# Patient Record
Sex: Male | Born: 1985 | Race: White | Hispanic: No | Marital: Married | Smoking: Never smoker
Health system: Southern US, Community
[De-identification: ages and names within clinical notes are randomized; demographics above are authoritative.]

## PROBLEM LIST (undated history)

## (undated) DIAGNOSIS — Z8619 Personal history of other infectious and parasitic diseases: Secondary | ICD-10-CM

## (undated) DIAGNOSIS — Z87820 Personal history of traumatic brain injury: Secondary | ICD-10-CM

## (undated) DIAGNOSIS — C6290 Malignant neoplasm of unspecified testis, unspecified whether descended or undescended: Secondary | ICD-10-CM

## (undated) HISTORY — PX: HERNIA REPAIR: SHX51

## (undated) HISTORY — DX: Malignant neoplasm of unspecified testis, unspecified whether descended or undescended: C62.90

## (undated) HISTORY — PX: KNEE ARTHROSCOPY: SHX127

## (undated) HISTORY — DX: Personal history of other infectious and parasitic diseases: Z86.19

---

## 2013-10-26 ENCOUNTER — Emergency Department (HOSPITAL_BASED_OUTPATIENT_CLINIC_OR_DEPARTMENT_OTHER)
Admission: EM | Admit: 2013-10-26 | Discharge: 2013-10-27 | Disposition: A | Discharge status: Home / Self Care | Payer: BC Managed Care – PPO | Attending: Emergency Medicine | Admitting: Emergency Medicine

## 2013-10-26 ENCOUNTER — Encounter (HOSPITAL_BASED_OUTPATIENT_CLINIC_OR_DEPARTMENT_OTHER): Payer: Self-pay | Admitting: Emergency Medicine

## 2013-10-26 DIAGNOSIS — R109 Unspecified abdominal pain: Secondary | ICD-10-CM

## 2013-10-26 DIAGNOSIS — R197 Diarrhea, unspecified: Secondary | ICD-10-CM | POA: Insufficient documentation

## 2013-10-26 DIAGNOSIS — R112 Nausea with vomiting, unspecified: Secondary | ICD-10-CM | POA: Insufficient documentation

## 2013-10-26 DIAGNOSIS — IMO0001 Reserved for inherently not codable concepts without codable children: Secondary | ICD-10-CM | POA: Insufficient documentation

## 2013-10-26 DIAGNOSIS — R1084 Generalized abdominal pain: Secondary | ICD-10-CM | POA: Insufficient documentation

## 2013-10-26 DIAGNOSIS — R1110 Vomiting, unspecified: Secondary | ICD-10-CM

## 2013-10-26 LAB — CBC WITH DIFFERENTIAL/PLATELET
Basophils Absolute: 0 10*3/uL (ref 0.0–0.1)
Basophils Relative: 0 % (ref 0–1)
EOS PCT: 0 % (ref 0–5)
Eosinophils Absolute: 0.1 10*3/uL (ref 0.0–0.7)
HEMATOCRIT: 46.6 % (ref 39.0–52.0)
Hemoglobin: 16.3 g/dL (ref 13.0–17.0)
LYMPHS ABS: 0.5 10*3/uL — AB (ref 0.7–4.0)
Lymphocytes Relative: 3 % — ABNORMAL LOW (ref 12–46)
MCH: 30 pg (ref 26.0–34.0)
MCHC: 35 g/dL (ref 30.0–36.0)
MCV: 85.8 fL (ref 78.0–100.0)
Monocytes Absolute: 0.7 10*3/uL (ref 0.1–1.0)
Monocytes Relative: 5 % (ref 3–12)
NEUTROS PCT: 92 % — AB (ref 43–77)
Neutro Abs: 14.8 10*3/uL — ABNORMAL HIGH (ref 1.7–7.7)
Platelets: 223 10*3/uL (ref 150–400)
RBC: 5.43 MIL/uL (ref 4.22–5.81)
RDW: 13.3 % (ref 11.5–15.5)
WBC: 16.1 10*3/uL — ABNORMAL HIGH (ref 4.0–10.5)

## 2013-10-26 MED ORDER — MORPHINE SULFATE 4 MG/ML IJ SOLN
4.0000 mg | Freq: Once | INTRAMUSCULAR | Status: AC
  Administered 2013-10-27: 4 mg via INTRAVENOUS
  Filled 2013-10-26: qty 1

## 2013-10-26 MED ORDER — ONDANSETRON HCL 4 MG/2ML IJ SOLN
4.0000 mg | Freq: Once | INTRAMUSCULAR | Status: AC
  Administered 2013-10-27: 4 mg via INTRAVENOUS
  Filled 2013-10-26: qty 2

## 2013-10-26 MED ORDER — SODIUM CHLORIDE 0.9 % IV BOLUS (SEPSIS)
1000.0000 mL | Freq: Once | INTRAVENOUS | Status: AC
  Administered 2013-10-27 (×2): 1000 mL via INTRAVENOUS

## 2013-10-26 NOTE — ED Notes (Signed)
Onset of generalized abdominal pain around 10 am this morning.  Vomiting/diarrhea started approximately 1500.  Profuse vomiting on multiple occassions.  Patient has reddened eyes.  C/o generalized 'squeezing' abdominal pain.  Noting blood in vomit now.

## 2013-10-26 NOTE — ED Provider Notes (Signed)
CSN: [id number]     Arrival date & time 10/26/13  2212 History   First MD Initiated Contact with Patient 10/26/13 2248     Chief Complaint  Patient presents with  . Emesis   (Consider location/radiation/quality/duration/timing/severity/associated sxs/prior Treatment) Patient is a 28 y.o. male presenting with vomiting. The history is provided by the patient. No language interpreter was used.  Emesis Severity:  Severe Duration:  1 day Associated symptoms: abdominal pain, chills, diarrhea and myalgias   Associated symptoms comment:  He started having nausea with abdominal cramping this morning, with multiple episodes of violent vomiting. There was BRB mixed with emesis later in the day that has improved. Non-bloody diarrhea as well. No known fever, but he reports hot/cold chills. No one else at home is ill. He has significant cramping affecting abdomen as well as distal extremities.    History reviewed. No pertinent past medical history. History reviewed. No pertinent past surgical history. No family history on file. History  Substance Use Topics  . Smoking status: Never Smoker   . Smokeless tobacco: Not on file  . Alcohol Use: No    Review of Systems  Constitutional: Positive for chills. Negative for fever.  HENT: Negative.   Respiratory: Negative.  Negative for cough.   Cardiovascular: Negative.  Negative for chest pain.  Gastrointestinal: Positive for nausea, vomiting, abdominal pain and diarrhea. Negative for blood in stool.  Genitourinary: Negative.  Negative for dysuria.  Musculoskeletal: Positive for myalgias.  Skin: Negative.  Negative for rash.  Neurological: Negative.  Negative for weakness.    Allergies  Naproxen  Home Medications  No current outpatient prescriptions on file. BP 137/79  Pulse 66  Temp(Src) 98.4 F (36.9 C) (Oral)  Resp 16  Ht 6' (1.829 m)  Wt 185 lb (83.915 kg)  BMI 25.08 kg/m2  SpO2 99% Physical Exam  Constitutional: He is oriented to  person, place, and time. He appears well-developed and well-nourished.  HENT:  Head: Normocephalic.  Neck: Normal range of motion. Neck supple.  Cardiovascular: Normal rate and regular rhythm.   Pulmonary/Chest: Effort normal and breath sounds normal.  Abdominal: Soft. Bowel sounds are normal. There is tenderness. There is no rebound and no guarding.  Generalized abdominal tenderness to soft abdomen. No mass. Non-distended.  Musculoskeletal: Normal range of motion.  Neurological: He is alert and oriented to person, place, and time.  Skin: Skin is warm and dry. No rash noted.  Psychiatric: He has a normal mood and affect.    ED Course  Procedures (including critical care time) Labs Review Labs Reviewed  URINALYSIS, ROUTINE W REFLEX MICROSCOPIC  BASIC METABOLIC PANEL  CBC WITH DIFFERENTIAL   Imaging Review No results found.  EKG Interpretation   None       MDM  No diagnosis found. 1. N, V, D  Patient care transferred to Dr. Thayer Jew pending labs and re-evaluation.    Dewaine Oats, PA-C 10/26/13 2354

## 2013-10-27 LAB — BASIC METABOLIC PANEL
BUN: 16 mg/dL (ref 6–23)
CHLORIDE: 104 meq/L (ref 96–112)
CO2: 22 meq/L (ref 19–32)
Calcium: 9.8 mg/dL (ref 8.4–10.5)
Creatinine, Ser: 0.9 mg/dL (ref 0.50–1.35)
GFR calc Af Amer: >90 mL/min (ref >90)
GFR calc non Af Amer: >90 mL/min (ref >90)
GLUCOSE: 121 mg/dL — AB (ref 70–99)
POTASSIUM: 4.2 meq/L (ref 3.7–5.3)
Sodium: 144 mEq/L (ref 137–147)

## 2013-10-27 MED ORDER — ONDANSETRON HCL 4 MG PO TABS: 4.0000 mg | ORAL_TABLET | Freq: Four times a day (QID) | ORAL | Status: DC

## 2013-10-27 MED ORDER — LOPERAMIDE HCL 2 MG PO CAPS: 2.0000 mg | ORAL_CAPSULE | Freq: Four times a day (QID) | ORAL | Status: DC | PRN

## 2013-10-27 NOTE — ED Provider Notes (Signed)
Received patient in sign out from [place], [place].  Patient presented with abdominal pain, nausea, vomiting, and diarrhea. Otherwise healthy. Patient has had multiple episodes of diarrhea here and appears clinically dry. Patient was given 2 L of normal saline and Zofran. Lab work is notable for leukocytosis to 16.  Otherwise lab work is reassuring. Following 2 L of fluid, patient endorses improvement of symptoms.  Exam is benign. Patient was given a fluid challenge and was able tolerate by mouth. Patient will be discharged with Zofran and Imodium for what is likely viral gastroenteritis. Patient was given strict return precautions.  After history, exam, and medical workup I feel the patient has been appropriately medically screened and is safe for discharge home. Pertinent diagnoses were discussed with the patient. Patient was given return precautions.  Results for orders placed during the hospital encounter of 93/79/02  BASIC METABOLIC PANEL      Result Value Range   Sodium 144  137 - 147 mEq/L   Potassium 4.2  3.7 - 5.3 mEq/L   Chloride 104  96 - 112 mEq/L   CO2 22  19 - 32 mEq/L   Glucose, Bld 121 (*) 70 - 99 mg/dL   BUN 16  6 - 23 mg/dL   Creatinine, Ser 0.90  0.50 - 1.35 mg/dL   Calcium 9.8  8.4 - 10.5 mg/dL   GFR calc non Af Amer >90  >90 mL/min   GFR calc Af Amer >90  >90 mL/min  CBC WITH DIFFERENTIAL      Result Value Range   WBC 16.1 (*) 4.0 - 10.5 K/uL   RBC 5.43  4.22 - 5.81 MIL/uL   Hemoglobin 16.3  13.0 - 17.0 g/dL   HCT 46.6  39.0 - 52.0 %   MCV 85.8  78.0 - 100.0 fL   MCH 30.0  26.0 - 34.0 pg   MCHC 35.0  30.0 - 36.0 g/dL   RDW 13.3  11.5 - 15.5 %   Platelets 223  150 - 400 K/uL   Neutrophils Relative % 92 (*) 43 - 77 %   Neutro Abs 14.8 (*) 1.7 - 7.7 K/uL   Lymphocytes Relative 3 (*) 12 - 46 %   Lymphs Abs 0.5 (*) 0.7 - 4.0 K/uL   Monocytes Relative 5  3 - 12 %   Monocytes Absolute 0.7  0.1 - 1.0 K/uL   Eosinophils Relative 0  0 - 5 %   Eosinophils Absolute 0.1  0.0 -  0.7 K/uL   Basophils Relative 0  0 - 1 %   Basophils Absolute 0.0  0.0 - 0.1 K/uL   No results found.    Merryl Hacker, MD 10/27/13 [phone]

## 2013-10-27 NOTE — ED Provider Notes (Signed)
Medical screening examination/treatment/procedure(s) were conducted as a shared visit with non-physician practitioner(s) and myself.  I personally evaluated the patient during the encounter.  EKG Interpretation   None       Please see separate note for details.  Merryl Hacker, MD 10/27/13 [phone]

## 2013-10-27 NOTE — Discharge Instructions (Signed)
Viral Gastroenteritis Viral gastroenteritis is also known as stomach flu. This condition affects the stomach and intestinal tract. It can cause sudden diarrhea and vomiting. The illness typically lasts 3 to 8 days. Most people develop an immune response that eventually gets rid of the virus. While this natural response develops, the virus can make you quite ill. CAUSES  Many different viruses can cause gastroenteritis, such as rotavirus or noroviruses. You can catch one of these viruses by consuming contaminated food or water. You may also catch a virus by sharing utensils or other personal items with an infected person or by touching a contaminated surface. SYMPTOMS  The most common symptoms are diarrhea and vomiting. These problems can cause a severe loss of body fluids (dehydration) and a body salt (electrolyte) imbalance. Other symptoms may include:  Fever.  Headache.  Fatigue.  Abdominal pain. DIAGNOSIS  Your caregiver can usually diagnose viral gastroenteritis based on your symptoms and a physical exam. A stool sample may also be taken to test for the presence of viruses or other infections. TREATMENT  This illness typically goes away on its own. Treatments are aimed at rehydration. The most serious cases of viral gastroenteritis involve vomiting so severely that you are not able to keep fluids down. In these cases, fluids must be given through an intravenous line (IV). HOME CARE INSTRUCTIONS   Drink enough fluids to keep your urine clear or pale yellow. Drink small amounts of fluids frequently and increase the amounts as tolerated.  Ask your caregiver for specific rehydration instructions.  Avoid:  Foods high in sugar.  Alcohol.  Carbonated drinks.  Tobacco.  Juice.  Caffeine drinks.  Extremely hot or cold fluids.  Fatty, greasy foods.  Too much intake of anything at one time.  Dairy products until 24 to 48 hours after diarrhea stops.  You may consume probiotics.  Probiotics are active cultures of beneficial bacteria. They may lessen the amount and number of diarrheal stools in adults. Probiotics can be found in yogurt with active cultures and in supplements.  Wash your hands well to avoid spreading the virus.  Only take over-the-counter or prescription medicines for pain, discomfort, or fever as directed by your caregiver. Do not give aspirin to children. Antidiarrheal medicines are not recommended.  Ask your caregiver if you should continue to take your regular prescribed and over-the-counter medicines.  Keep all follow-up appointments as directed by your caregiver. SEEK IMMEDIATE MEDICAL CARE IF:   You are unable to keep fluids down.  You do not urinate at least once every 6 to 8 hours.  You develop shortness of breath.  You notice blood in your stool or vomit. This may look like coffee grounds.  You have abdominal pain that increases or is concentrated in one small area (localized).  You have persistent vomiting or diarrhea.  You have a fever.  The patient is a child younger than 3 months, and he or she has a fever.  The patient is a child older than 3 months, and he or she has a fever and persistent symptoms.  The patient is a child older than 3 months, and he or she has a fever and symptoms suddenly get worse.  The patient is a baby, and he or she has no tears when crying. MAKE SURE YOU:   Understand these instructions.  Will watch your condition.  Will get help right away if you are not doing well or get worse. Document Released: 09/12/2005 Document Revised: 12/05/2011 Document Reviewed: 06/29/2011   Exit[employer] Patient Information 2014 [employer].  

## 2017-09-29 ENCOUNTER — Emergency Department (HOSPITAL_COMMUNITY)
Admission: EM | Admit: 2017-09-29 | Discharge: 2017-09-29 | Disposition: A | Discharge status: Home / Self Care | Payer: BLUE CROSS/BLUE SHIELD | Attending: Emergency Medicine | Admitting: Emergency Medicine

## 2017-09-29 ENCOUNTER — Emergency Department (HOSPITAL_COMMUNITY): Payer: BLUE CROSS/BLUE SHIELD

## 2017-09-29 ENCOUNTER — Other Ambulatory Visit: Payer: Self-pay

## 2017-09-29 ENCOUNTER — Encounter (HOSPITAL_COMMUNITY): Payer: Self-pay | Admitting: *Deleted

## 2017-09-29 DIAGNOSIS — Y998 Other external cause status: Secondary | ICD-10-CM | POA: Insufficient documentation

## 2017-09-29 DIAGNOSIS — Y9389 Activity, other specified: Secondary | ICD-10-CM | POA: Insufficient documentation

## 2017-09-29 DIAGNOSIS — R110 Nausea: Secondary | ICD-10-CM | POA: Diagnosis not present

## 2017-09-29 DIAGNOSIS — Y92012 Bathroom of single-family (private) house as the place of occurrence of the external cause: Secondary | ICD-10-CM | POA: Diagnosis not present

## 2017-09-29 DIAGNOSIS — R55 Syncope and collapse: Secondary | ICD-10-CM | POA: Insufficient documentation

## 2017-09-29 DIAGNOSIS — R51 Headache: Secondary | ICD-10-CM | POA: Diagnosis not present

## 2017-09-29 DIAGNOSIS — W19XXXA Unspecified fall, initial encounter: Secondary | ICD-10-CM | POA: Diagnosis not present

## 2017-09-29 DIAGNOSIS — R519 Headache, unspecified: Secondary | ICD-10-CM

## 2017-09-29 HISTORY — DX: Personal history of traumatic brain injury: Z87.820

## 2017-09-29 LAB — COMPREHENSIVE METABOLIC PANEL
ALT: 27 U/L (ref 17–63)
AST: 23 U/L (ref 15–41)
Albumin: 4.3 g/dL (ref 3.5–5.0)
Alkaline Phosphatase: 62 U/L (ref 38–126)
Anion gap: 8 (ref 5–15)
BUN: 15 mg/dL (ref 6–20)
CO2: 23 mmol/L (ref 22–32)
Calcium: 9.1 mg/dL (ref 8.9–10.3)
Chloride: 106 mmol/L (ref 101–111)
Creatinine, Ser: 0.91 mg/dL (ref 0.61–1.24)
GFR calc non Af Amer: >60 mL/min (ref >60)
Glucose, Bld: 106 mg/dL — ABNORMAL HIGH (ref 65–99)
Potassium: 3.4 mmol/L — ABNORMAL LOW (ref 3.5–5.1)
Sodium: 137 mmol/L (ref 135–145)
Total Bilirubin: 1.1 mg/dL (ref 0.3–1.2)
Total Protein: 6.6 g/dL (ref 6.5–8.1)

## 2017-09-29 LAB — URINALYSIS, ROUTINE W REFLEX MICROSCOPIC
Bilirubin Urine: NEGATIVE
Glucose, UA: NEGATIVE mg/dL
HGB URINE DIPSTICK: NEGATIVE
Ketones, ur: NEGATIVE mg/dL
Leukocytes, UA: NEGATIVE
Nitrite: NEGATIVE
Protein, ur: NEGATIVE mg/dL
SPECIFIC GRAVITY, URINE: 1.009 (ref 1.005–1.030)
pH: 6 (ref 5.0–8.0)

## 2017-09-29 LAB — CBC
HCT: 44.8 % (ref 39.0–52.0)
HEMOGLOBIN: 15.2 g/dL (ref 13.0–17.0)
MCH: 29.8 pg (ref 26.0–34.0)
MCHC: 33.9 g/dL (ref 30.0–36.0)
MCV: 87.8 fL (ref 78.0–100.0)
Platelets: 195 10*3/uL (ref 150–400)
RBC: 5.1 MIL/uL (ref 4.22–5.81)
RDW: 13.1 % (ref 11.5–15.5)
WBC: 9.4 10*3/uL (ref 4.0–10.5)

## 2017-09-29 LAB — I-STAT TROPONIN, ED: Troponin i, poc: 0 ng/mL (ref 0.00–0.08)

## 2017-09-29 MED ORDER — DIPHENHYDRAMINE HCL 50 MG/ML IJ SOLN
25.0000 mg | Freq: Once | INTRAMUSCULAR | Status: AC
  Administered 2017-09-29: 25 mg via INTRAVENOUS
  Filled 2017-09-29: qty 1

## 2017-09-29 MED ORDER — ONDANSETRON HCL 4 MG PO TABS: 4.0000 mg | ORAL_TABLET | Freq: Three times a day (TID) | ORAL | 0 refills | Status: DC | PRN

## 2017-09-29 MED ORDER — METOCLOPRAMIDE HCL 5 MG/ML IJ SOLN
10.0000 mg | Freq: Once | INTRAMUSCULAR | Status: AC
  Administered 2017-09-29: 10 mg via INTRAVENOUS
  Filled 2017-09-29: qty 2

## 2017-09-29 MED ORDER — ONDANSETRON HCL 4 MG/2ML IJ SOLN
4.0000 mg | Freq: Once | INTRAMUSCULAR | Status: AC
  Administered 2017-09-29: 4 mg via INTRAVENOUS
  Filled 2017-09-29: qty 2

## 2017-09-29 MED ORDER — PROMETHAZINE HCL 25 MG/ML IJ SOLN
12.5000 mg | Freq: Once | INTRAMUSCULAR | Status: AC
  Administered 2017-09-29: 12.5 mg via INTRAVENOUS
  Filled 2017-09-29: qty 1

## 2017-09-29 MED ORDER — SODIUM CHLORIDE 0.9 % IV BOLUS (SEPSIS)
500.0000 mL | Freq: Once | INTRAVENOUS | Status: AC
  Administered 2017-09-29: 500 mL via INTRAVENOUS

## 2017-09-29 MED ORDER — SODIUM CHLORIDE 0.9 % IV BOLUS (SEPSIS)
1000.0000 mL | Freq: Once | INTRAVENOUS | Status: AC
  Administered 2017-09-29: 1000 mL via INTRAVENOUS

## 2017-09-29 NOTE — ED Notes (Signed)
Transported to xray for CT 

## 2017-09-29 NOTE — ED Provider Notes (Signed)
[facility] EMERGENCY DEPARTMENT Provider Note   CSN: [id number] Arrival date & time: 09/29/17  0346  Time seen 04:23 AM   History   Chief Complaint Chief Complaint  Patient presents with  . Loss of Consciousness    HPI Larance Ratledge is a 32 y.o. male.  HPI patient reports his wife is been ill all day with a GI bug.  About 2 AM therefore and 34/48-month-old child woke up with vomiting and she had vomited all over herself.  He states he jumped out of bed and grabbed the baby and was rushing down the hall to her dressing table to change her.  He states he felt like he was getting nauseated and after he got her gown off he felt like he was going to pass out.  He states he placed the baby back in her crib and started running down the hall to the bathroom.  He states when he got to the bathroom he was urinating and he started feeling like he was going to pass out and the next thing he knew he was awakened and had fallen over the toilet.  His wife helped him to the floor.  She states he was laying on his left side.  He is unsure if he hit his head.  She states he was unconscious for about a minute.  She denies diaphoresis but states he was very pale.  He states he had a concussion in 2017 and had posterior headaches for several months after that.  He was evaluated by a neurologist and Jule Ser.  He states he had a posterior headache all day today and states it felt like he had a thumb pressing on the back of his head.  He denies any neck pain.  He states he thinks he may have stubbed some toes.  He states normally other people vomiting does not affect him.  PCP Dr Olin [facility] in [place]  Past Medical History:  Diagnosis Date  . H/O multiple concussions   Sleep paralysis  There are no active problems to display for this patient.   Past Surgical History:  Procedure Laterality Date  . HERNIA REPAIR    . KNEE ARTHROSCOPY         Home Medications      Prior to Admission medications   Medication Sig Start Date End Date Taking? Authorizing Provider  loperamide (IMODIUM) 2 MG capsule Take 1 capsule (2 mg total) by mouth 4 (four) times daily as needed for diarrhea or loose stools. 10/27/13  Yes Horton, Barbette Hair, MD  ondansetron (ZOFRAN) 4 MG tablet Take 1 tablet (4 mg total) by mouth every 8 (eight) hours as needed for nausea or vomiting. 09/29/17   Rolland Porter, MD    Family History No family history on file.  Social History Social History   Tobacco Use  . Smoking status: Never Smoker  . Smokeless tobacco: Never Used  Substance Use Topics  . Alcohol use: No  . Drug use: No  lives with spouse   Allergies   Naproxen   Review of Systems Review of Systems  All other systems reviewed and are negative.    Physical Exam Updated Vital Signs BP 123/88 (BP Location: Right Arm)   Pulse 64   Temp 97.7 F (36.5 C) (Oral)   Resp 18   Ht 6' (1.829 m)   Wt 83.9 kg (185 lb)   SpO2 99%   BMI 25.09 kg/m   Vital signs normal  Physical Exam  Constitutional: He is oriented to person, place, and time. He appears well-developed and well-nourished.  Non-toxic appearance. He does not appear ill. No distress.  HENT:  Head: Normocephalic and atraumatic.  Right Ear: External ear normal.  Left Ear: External ear normal.  Nose: Nose normal. No mucosal edema or rhinorrhea.  Mouth/Throat: Mucous membranes are normal. No dental abscesses or uvula swelling.  Tongue is dry, there was no trauma to the tongue.  There is no visible trauma to his head.  Eyes: Conjunctivae and EOM are normal. Pupils are equal, round, and reactive to light.  Neck: Normal range of motion and full passive range of motion without pain. Neck supple.  Cardiovascular: Normal rate, regular rhythm and normal heart sounds. Exam reveals no gallop and no friction rub.  No murmur heard. Pulmonary/Chest: Effort normal and breath sounds normal. No respiratory distress. He has  no wheezes. He has no rhonchi. He has no rales. He exhibits no tenderness and no crepitus.  Abdominal: Soft. Normal appearance and bowel sounds are normal. He exhibits no distension. There is no tenderness. There is no rebound and no guarding.  Musculoskeletal: Normal range of motion. He exhibits no edema or tenderness.  Moves all extremities well.  Cervical spine is nontender to palpation.  He moves his head freely.  He indicates his second and third left toes were the ones that he hit.  There is no bruising or swelling.  I can palpate them in the bone feels intact.  He has minor discomfort to palpation.  At this point x-rays were not felt to be indicated.  Neurological: He is alert and oriented to person, place, and time. He has normal strength. No cranial nerve deficit.  Skin: Skin is warm, dry and intact. No rash noted. No erythema. No pallor.  Psychiatric: He has a normal mood and affect. His speech is normal and behavior is normal. His mood appears not anxious.  Nursing note and vitals reviewed.    ED Treatments / Results  Labs (all labs ordered are listed, but only abnormal results are displayed)  Results for orders placed or performed during the hospital encounter of 09/29/17  CBC  Result Value Ref Range   WBC 9.4 4.0 - 10.5 K/uL   RBC 5.10 4.22 - 5.81 MIL/uL   Hemoglobin 15.2 13.0 - 17.0 g/dL   HCT 44.8 39.0 - 52.0 %   MCV 87.8 78.0 - 100.0 fL   MCH 29.8 26.0 - 34.0 pg   MCHC 33.9 30.0 - 36.0 g/dL   RDW 13.1 11.5 - 15.5 %   Platelets 195 150 - 400 K/uL  Urinalysis, Routine w reflex microscopic  Result Value Ref Range   Color, Urine STRAW (A) YELLOW   APPearance CLEAR CLEAR   Specific Gravity, Urine 1.009 1.005 - 1.030   pH 6.0 5.0 - 8.0   Glucose, UA NEGATIVE NEGATIVE mg/dL   Hgb urine dipstick NEGATIVE NEGATIVE   Bilirubin Urine NEGATIVE NEGATIVE   Ketones, ur NEGATIVE NEGATIVE mg/dL   Protein, ur NEGATIVE NEGATIVE mg/dL   Nitrite NEGATIVE NEGATIVE   Leukocytes, UA  NEGATIVE NEGATIVE  Comprehensive metabolic panel  Result Value Ref Range   Sodium 137 135 - 145 mmol/L   Potassium 3.4 (L) 3.5 - 5.1 mmol/L   Chloride 106 101 - 111 mmol/L   CO2 23 22 - 32 mmol/L   Glucose, Bld 106 (H) 65 - 99 mg/dL   BUN 15 6 - 20 mg/dL   Creatinine, Ser 0.91  0.61 - 1.24 mg/dL   Calcium 9.1 8.9 - 10.3 mg/dL   Total Protein 6.6 6.5 - 8.1 g/dL   Albumin 4.3 3.5 - 5.0 g/dL   AST 23 15 - 41 U/L   ALT 27 17 - 63 U/L   Alkaline Phosphatase 62 38 - 126 U/L   Total Bilirubin 1.1 0.3 - 1.2 mg/dL   GFR calc non Af Amer >60 >60 mL/min   GFR calc Af Amer >60 >60 mL/min   Anion gap 8 5 - 15  I-stat troponin, ED  Result Value Ref Range   Troponin i, poc 0.00 0.00 - 0.08 ng/mL   Comment 3            Laboratory interpretation all normal except mild hypokalemia   EKG  EKG Interpretation None       ED ECG REPORT   Date: 09/29/2017  Rate: 58  Rhythm: sinus bradycardia  QRS Axis: normal  Intervals: normal  ST/T Wave abnormalities: early repolarization  Conduction Disutrbances:none  Narrative Interpretation:   Old EKG Reviewed: none available  I have personally reviewed the EKG tracing and agree with the computerized printout as noted.   Radiology Ct Head Wo Contrast  Result Date: 09/29/2017 CLINICAL DATA:  Acute headache.  Syncope. EXAM: CT HEAD WITHOUT CONTRAST TECHNIQUE: Contiguous axial images were obtained from the base of the skull through the vertex without intravenous contrast. COMPARISON:  None. FINDINGS: Brain: No intracranial hemorrhage, mass effect, or midline shift. No hydrocephalus. The basilar cisterns are patent. No evidence of territorial infarct or acute ischemia. No extra-axial or intracranial fluid collection. Vascular: No hyperdense vessel or unexpected calcification. Skull: No fracture or focal lesion. Sinuses/Orbits: Paranasal sinuses and mastoid air cells are clear. The visualized orbits are unremarkable. Other: None. IMPRESSION: Normal  noncontrast head CT. Electronically Signed   By: Jeb Levering M.D.   On: 09/29/2017 06:26    Procedures Procedures (including critical care time)  Medications Ordered in ED Medications  ondansetron (ZOFRAN) injection 4 mg (not administered)  sodium chloride 0.9 % bolus 1,000 mL (0 mLs Intravenous Stopped 09/29/17 0648)  sodium chloride 0.9 % bolus 500 mL (0 mLs Intravenous Stopped 09/29/17 0648)  metoCLOPramide (REGLAN) injection 10 mg (10 mg Intravenous Given 09/29/17 0509)  diphenhydrAMINE (BENADRYL) injection 25 mg (25 mg Intravenous Given 09/29/17 0510)  ondansetron (ZOFRAN) injection 4 mg (4 mg Intravenous Given 09/29/17 5053)     Initial Impression / Assessment and Plan / ED Course  I have reviewed the triage vital signs and the nursing notes.  Pertinent labs & imaging results that were available during my care of the patient were reviewed by me and considered in my medical decision making (see chart for details).     Patient states he is currently having some nausea, he was given IV fluids, IV Reglan and Benadryl for his nausea and for his headache.  Recheck at 6:35 AM patient states his headache is almost gone, he states he still has some mild nausea.  We discussed his test results including his head CT.  Of note his wife had to signed into the ED to be seen for vomiting and diarrhea.  7:10 AM nurses report patient is now actively vomiting.  The Zofran was repeated.  07:40 AM Patient turned over to Dr Gilford Raid at change of shift, hopefully he will be able to be discharged once his nausea is controlled.   Final Clinical Impressions(s) / ED Diagnoses   Final diagnoses:  Vasovagal syncope  Acute  nonintractable headache, unspecified headache type  Nausea    ED Discharge Orders        Ordered    ondansetron (ZOFRAN) 4 MG tablet  Every 8 hours PRN     09/29/17 0708      Disposition pending but will most likely be discharge  Rolland Porter, MD, Barbette Or,  MD 09/29/17 [phone]

## 2017-09-29 NOTE — ED Provider Notes (Signed)
Pt signed out by Dr. Tomi Bamberger pending feeling better.  Pt had another episode of emesis and received phenergan and an additional 1L NS.  Pt is now feeling better.  He is stable for d/c and instructed to f/u with pcp.   Isla Pence, MD 09/29/17 1030

## 2017-09-29 NOTE — ED Notes (Signed)
Patient is resting with call bell in reach  

## 2017-09-29 NOTE — ED Notes (Signed)
Patient returned from Ct

## 2017-09-29 NOTE — Discharge Instructions (Signed)
Drink plenty of fluids (clear liquids) then start a bland diet later this morning such as toast, crackers, jello, [employer] chicken noodle soup. Use the zofran for nausea or vomiting. Take imodium OTC if you develop  diarrhea. Avoid milk products until the diarrhea is gone. Return to the ED for any problems listed on the head injury sheet. If you continue to have headaches, contact your neurologist in [place].

## 2017-09-29 NOTE — ED Triage Notes (Signed)
Patient states he has had a headache all day states it was tolerable however it hurt . States he heard his baby crying went to get his baby she vomited he felt like he was going to vomit put the baby down went to bathroom remembers urinating then states he went to vomit and had a syncopal episode and wife found him on the floor. Currently alert oriented Wife has been vomiting all day as well.

## 2018-04-06 ENCOUNTER — Other Ambulatory Visit: Payer: Self-pay | Admitting: Urology

## 2018-04-06 DIAGNOSIS — N433 Hydrocele, unspecified: Secondary | ICD-10-CM

## 2018-04-11 ENCOUNTER — Ambulatory Visit (HOSPITAL_COMMUNITY)
Admission: RE | Admit: 2018-04-11 | Discharge: 2018-04-11 | Disposition: A | Discharge status: Home / Self Care | Payer: BLUE CROSS/BLUE SHIELD | Source: Ambulatory Visit | Attending: Urology | Admitting: Urology

## 2018-04-11 DIAGNOSIS — N5089 Other specified disorders of the male genital organs: Secondary | ICD-10-CM | POA: Insufficient documentation

## 2018-04-11 DIAGNOSIS — N433 Hydrocele, unspecified: Secondary | ICD-10-CM | POA: Diagnosis not present

## 2018-05-08 ENCOUNTER — Encounter (HOSPITAL_COMMUNITY): Payer: Self-pay | Admitting: *Deleted

## 2018-05-08 ENCOUNTER — Other Ambulatory Visit: Payer: Self-pay | Admitting: Urology

## 2018-05-08 ENCOUNTER — Other Ambulatory Visit: Payer: Self-pay

## 2018-05-09 ENCOUNTER — Ambulatory Visit (HOSPITAL_COMMUNITY): Payer: BLUE CROSS/BLUE SHIELD | Admitting: Anesthesiology

## 2018-05-09 ENCOUNTER — Encounter (HOSPITAL_COMMUNITY): Payer: Self-pay | Admitting: *Deleted

## 2018-05-09 ENCOUNTER — Ambulatory Visit (HOSPITAL_COMMUNITY)
Admission: RE | Admit: 2018-05-09 | Discharge: 2018-05-09 | Disposition: A | Discharge status: Home / Self Care | Payer: BLUE CROSS/BLUE SHIELD | Source: Ambulatory Visit | Attending: Urology | Admitting: Urology

## 2018-05-09 ENCOUNTER — Encounter (HOSPITAL_COMMUNITY)
Admission: RE | Disposition: A | Discharge status: Home / Self Care | Payer: Self-pay | Source: Ambulatory Visit | Attending: Urology

## 2018-05-09 ENCOUNTER — Other Ambulatory Visit: Payer: Self-pay | Admitting: Urology

## 2018-05-09 DIAGNOSIS — N500 Atrophy of testis: Secondary | ICD-10-CM | POA: Insufficient documentation

## 2018-05-09 DIAGNOSIS — N433 Hydrocele, unspecified: Secondary | ICD-10-CM | POA: Diagnosis not present

## 2018-05-09 DIAGNOSIS — C6201 Malignant neoplasm of undescended right testis: Secondary | ICD-10-CM | POA: Diagnosis not present

## 2018-05-09 DIAGNOSIS — Z8043 Family history of malignant neoplasm of testis: Secondary | ICD-10-CM | POA: Insufficient documentation

## 2018-05-09 HISTORY — PX: ORCHIECTOMY: SHX2116

## 2018-05-09 LAB — CBC
HEMATOCRIT: 48.2 % (ref 39.0–52.0)
HEMOGLOBIN: 16.6 g/dL (ref 13.0–17.0)
MCH: 30.1 pg (ref 26.0–34.0)
MCHC: 34.4 g/dL (ref 30.0–36.0)
MCV: 87.3 fL (ref 78.0–100.0)
Platelets: 229 10*3/uL (ref 150–400)
RBC: 5.52 MIL/uL (ref 4.22–5.81)
RDW: 12.8 % (ref 11.5–15.5)
WBC: 8.7 10*3/uL (ref 4.0–10.5)

## 2018-05-09 SURGERY — ORCHIECTOMY
Anesthesia: General | Laterality: Right

## 2018-05-09 MED ORDER — MEPERIDINE HCL 50 MG/ML IJ SOLN
INTRAMUSCULAR | Status: AC
  Filled 2018-05-09: qty 1

## 2018-05-09 MED ORDER — ONDANSETRON 4 MG PO TBDP
ORAL_TABLET | ORAL | Status: AC
  Filled 2018-05-09: qty 1

## 2018-05-09 MED ORDER — DEXAMETHASONE SODIUM PHOSPHATE 10 MG/ML IJ SOLN
INTRAMUSCULAR | Status: DC | PRN
  Administered 2018-05-09: 10 mg via INTRAVENOUS

## 2018-05-09 MED ORDER — PROPOFOL 10 MG/ML IV BOLUS
INTRAVENOUS | Status: AC
Start: 2018-05-09 — End: ?
  Filled 2018-05-09: qty 20

## 2018-05-09 MED ORDER — DEXAMETHASONE SODIUM PHOSPHATE 10 MG/ML IJ SOLN
INTRAMUSCULAR | Status: AC
  Filled 2018-05-09: qty 1

## 2018-05-09 MED ORDER — LIDOCAINE HCL (CARDIAC) PF 50 MG/5ML IV SOSY
PREFILLED_SYRINGE | INTRAVENOUS | Status: DC | PRN
  Administered 2018-05-09: 100 mg via INTRAVENOUS

## 2018-05-09 MED ORDER — HYDROMORPHONE HCL 1 MG/ML IJ SOLN
INTRAMUSCULAR | Status: AC
  Filled 2018-05-09: qty 1

## 2018-05-09 MED ORDER — BUPIVACAINE HCL (PF) 0.25 % IJ SOLN
INTRAMUSCULAR | Status: AC
  Filled 2018-05-09: qty 30

## 2018-05-09 MED ORDER — PROPOFOL 10 MG/ML IV BOLUS
INTRAVENOUS | Status: DC | PRN
  Administered 2018-05-09: 200 mg via INTRAVENOUS

## 2018-05-09 MED ORDER — CEFAZOLIN SODIUM-DEXTROSE 2-4 GM/100ML-% IV SOLN
2.0000 g | INTRAVENOUS | Status: AC
  Administered 2018-05-09: 2 g via INTRAVENOUS
  Filled 2018-05-09: qty 100

## 2018-05-09 MED ORDER — MIDAZOLAM HCL 5 MG/5ML IJ SOLN
INTRAMUSCULAR | Status: DC | PRN
  Administered 2018-05-09: 2 mg via INTRAVENOUS

## 2018-05-09 MED ORDER — PROMETHAZINE HCL 25 MG/ML IJ SOLN: 6.2500 mg | INTRAMUSCULAR | Status: DC | PRN

## 2018-05-09 MED ORDER — ONDANSETRON HCL 4 MG/2ML IJ SOLN
INTRAMUSCULAR | Status: AC
  Filled 2018-05-09: qty 2

## 2018-05-09 MED ORDER — FENTANYL CITRATE (PF) 100 MCG/2ML IJ SOLN
INTRAMUSCULAR | Status: DC | PRN
  Administered 2018-05-09 (×3): 50 ug via INTRAVENOUS
  Administered 2018-05-09: 100 ug via INTRAVENOUS

## 2018-05-09 MED ORDER — SENNOSIDES-DOCUSATE SODIUM 8.6-50 MG PO TABS: 1.0000 | ORAL_TABLET | Freq: Two times a day (BID) | ORAL | 0 refills | Status: DC

## 2018-05-09 MED ORDER — MIDAZOLAM HCL 2 MG/2ML IJ SOLN
INTRAMUSCULAR | Status: AC
  Filled 2018-05-09: qty 2

## 2018-05-09 MED ORDER — HYDROMORPHONE HCL 1 MG/ML IJ SOLN
0.2500 mg | INTRAMUSCULAR | Status: DC | PRN
  Administered 2018-05-09 (×3): 0.5 mg via INTRAVENOUS

## 2018-05-09 MED ORDER — LACTATED RINGERS IV SOLN
INTRAVENOUS | Status: DC
  Administered 2018-05-09 (×2): via INTRAVENOUS

## 2018-05-09 MED ORDER — MEPERIDINE HCL 50 MG/ML IJ SOLN
6.2500 mg | INTRAMUSCULAR | Status: DC | PRN
  Administered 2018-05-09: 6.25 mg via INTRAVENOUS

## 2018-05-09 MED ORDER — HYDROMORPHONE HCL 1 MG/ML IJ SOLN
INTRAMUSCULAR | Status: AC
  Administered 2018-05-09: 0.5 mg via INTRAVENOUS
  Filled 2018-05-09: qty 1

## 2018-05-09 MED ORDER — BUPIVACAINE HCL (PF) 0.25 % IJ SOLN
INTRAMUSCULAR | Status: DC | PRN
  Administered 2018-05-09: 20 mL

## 2018-05-09 MED ORDER — ONDANSETRON 4 MG PO TBDP
4.0000 mg | ORAL_TABLET | Freq: Once | ORAL | Status: AC
  Administered 2018-05-09: 4 mg via ORAL

## 2018-05-09 MED ORDER — OXYCODONE-ACETAMINOPHEN 5-325 MG PO TABS: 1.0000 | ORAL_TABLET | Freq: Four times a day (QID) | ORAL | 0 refills | Status: DC | PRN

## 2018-05-09 MED ORDER — 0.9 % SODIUM CHLORIDE (POUR BTL) OPTIME
TOPICAL | Status: DC | PRN
  Administered 2018-05-09: 1000 mL

## 2018-05-09 MED ORDER — ONDANSETRON HCL 4 MG/2ML IJ SOLN
INTRAMUSCULAR | Status: DC | PRN
  Administered 2018-05-09: 4 mg via INTRAVENOUS

## 2018-05-09 MED ORDER — OXYCODONE-ACETAMINOPHEN 5-325 MG PO TABS
ORAL_TABLET | ORAL | Status: AC
  Filled 2018-05-09: qty 2

## 2018-05-09 MED ORDER — FENTANYL CITRATE (PF) 250 MCG/5ML IJ SOLN
INTRAMUSCULAR | Status: AC
  Filled 2018-05-09: qty 5

## 2018-05-09 MED ORDER — OXYCODONE-ACETAMINOPHEN 5-325 MG PO TABS
2.0000 | ORAL_TABLET | Freq: Once | ORAL | Status: AC
  Administered 2018-05-09: 2 via ORAL

## 2018-05-09 SURGICAL SUPPLY — 30 items
BENZOIN TINCTURE PRP APPL 2/3 (GAUZE/BANDAGES/DRESSINGS) IMPLANT
BLADE HEX COATED 2.75 (ELECTRODE) IMPLANT
BLADE SURG 15 STRL LF DISP TIS (BLADE) ×1 IMPLANT
BLADE SURG 15 STRL SS (BLADE) ×1
BNDG GAUZE ELAST 4 BULKY (GAUZE/BANDAGES/DRESSINGS) ×2 IMPLANT
DERMABOND ADVANCED (GAUZE/BANDAGES/DRESSINGS) ×2
DERMABOND ADVANCED .7 DNX12 (GAUZE/BANDAGES/DRESSINGS) ×2 IMPLANT
DRAIN PENROSE 18X1/2 LTX STRL (DRAIN) ×2 IMPLANT
DRAIN PENROSE 18X1/4 LTX STRL (WOUND CARE) IMPLANT
DRAPE LAPAROTOMY T 98X78 PEDS (DRAPES) ×2 IMPLANT
ELECT PENCIL ROCKER SW 15FT (MISCELLANEOUS) IMPLANT
ELECT REM PT RETURN 15FT ADLT (MISCELLANEOUS) ×2 IMPLANT
GAUZE SPONGE 4X4 12PLY STRL (GAUZE/BANDAGES/DRESSINGS) ×2 IMPLANT
GLOVE BIOGEL M STRL SZ7.5 (GLOVE) ×2 IMPLANT
GOWN STRL REUS W/TWL LRG LVL3 (GOWN DISPOSABLE) ×4 IMPLANT
KIT BASIN OR (CUSTOM PROCEDURE TRAY) ×2 IMPLANT
NEEDLE HYPO 22GX1.5 SAFETY (NEEDLE) ×2 IMPLANT
NS IRRIG 1000ML POUR BTL (IV SOLUTION) IMPLANT
PACK GENERAL/GYN (CUSTOM PROCEDURE TRAY) ×2 IMPLANT
SPONGE LAP 4X18 RFD (DISPOSABLE) IMPLANT
SUPPORT SCROTAL LG STRP (MISCELLANEOUS) ×2 IMPLANT
SUT MNCRL AB 4-0 PS2 18 (SUTURE) ×2 IMPLANT
SUT SILK 0 (SUTURE) ×1
SUT SILK 0 30XBRD TIE 6 (SUTURE) ×1 IMPLANT
SUT VIC AB 3-0 SH 27 (SUTURE) ×2
SUT VIC AB 3-0 SH 27XBRD (SUTURE) ×2 IMPLANT
SYR 20CC LL (SYRINGE) IMPLANT
SYR CONTROL 10ML LL (SYRINGE) ×2 IMPLANT
TOWEL NATURAL 10PK STERILE (DISPOSABLE) ×2 IMPLANT
WATER STERILE IRR 1000ML POUR (IV SOLUTION) IMPLANT

## 2018-05-09 NOTE — Brief Op Note (Signed)
05/09/2018  6:32 PM  PATIENT:  Sandy Salaam  32 y.o. 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   Delay start of Pharmacological VTE agent (>24hrs) due to surgical blood loss or risk of bleeding: not applicable

## 2018-05-09 NOTE — H&P (Signed)
[name] is an 32 y.o. male.    Chief Complaint: Pre-Op RIGHT Radical Orchiectomy  HPI:   1 - Left Hydrocele / Hernia - Pt with left hydrocele / hernia x years. His father had testicular cancer. Although he can perform self exams he is reassured by occasional MD eval. Had unremarkable sctrotal [place] 2016. Exam 10/2017 w/o testis masses with reliable exam and mild left hydrocele / hernia. He has 1 adopted child and another to be adopted, his wife has early menaupause.   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 His right testicle is about 50% voluem of left. New Rt lower pole marble sized lump noted by pt 03/2018, exam here confirms firm non-tender area at inferior aspect of small / soft testis. [place] confirms solid, hypoechoic mass with internal bloodflow concerning fot possible testis cancer.   Initial Staging:  Markers: CMP, HCG, AFP - pending  Imaging: CT c/a/p - pending  Path: pending   PMH sig for ortho surgery, TNA. NO CV disease / blood thinners. He and his wife adopted a little girl in 2019. His PCP is Olin Hauser MD   Today "[name]" is seen to proceed with RIGHT radical inguinal orchiectomy.     Past Medical History:  Diagnosis Date  . H/O multiple concussions     Past Surgical History:  Procedure Laterality Date  . HERNIA REPAIR    . KNEE ARTHROSCOPY      History reviewed. No pertinent family history. Social History:  reports that he has never smoked. He has never used smokeless tobacco. He reports that he does not drink alcohol or use drugs.  Allergies:  Allergies  Allergen Reactions  . Naproxen Other (See Comments)    Stomach ulcer     No medications prior to admission.    No results found for this or any previous visit (from the past 48 hour(s)). No results found.  Review of Systems  Constitutional: Negative.  Negative for chills and fever.  HENT: Negative.   Eyes: Negative.    Respiratory: Negative.   Cardiovascular: Negative.   Gastrointestinal: Negative.   Genitourinary: Negative.   Musculoskeletal: Negative.   Skin: Negative.   Neurological: Negative.   Endo/Heme/Allergies: Negative.   Psychiatric/Behavioral: Negative.     There were no vitals taken for this visit. Physical Exam  Constitutional: He appears well-developed.  HENT:  Head: Normocephalic.  Eyes: Pupils are equal, round, and reactive to light.  Neck: Normal range of motion.  Cardiovascular: Normal rate.  Respiratory: Effort normal.  GI: Soft.  Genitourinary:  Genitourinary Comments: Prior Rt inguinal scar. Rt testis atrophic with marble sized firm lower pole mass / stable.   Neurological: He is alert.  Skin: Skin is warm.  Psychiatric: He has a normal mood and affect.     Assessment/Plan  Proceed as planned with RIGHT radical orcheictomy of atrophic testis with mass and prior maldescent. Risks, benefits, alterntives, expected peri-op course discussed previously and reiterated today.   Alexis Frock, MD 05/09/2018, 7:50 AM

## 2018-05-09 NOTE — Discharge Instructions (Signed)
1 - You may have mild swelling / bruising of scrotum and groin area that will resolve over next few weeks. This is normal.  2 - No lifting >40 lbs x 1 month, otherwise NO restrictions.   3 - Call MD or go to ER for fever >102, severe pain / nausea / vomiting not relieved by medications, or acute change in medical status

## 2018-05-09 NOTE — Anesthesia Preprocedure Evaluation (Signed)
Anesthesia Evaluation  Patient identified by MRN, date of birth, ID band Patient awake    Reviewed: Allergy & Precautions, NPO status , Patient's Chart, lab work & pertinent test results  Airway Mallampati: II  TM Distance: >3 FB Neck ROM: Full    Dental no notable dental hx.    Pulmonary neg pulmonary ROS,    Pulmonary exam normal breath sounds clear to auscultation       Cardiovascular negative cardio ROS Normal cardiovascular exam Rhythm:Regular Rate:Normal     Neuro/Psych negative neurological ROS  negative psych ROS   GI/Hepatic negative GI ROS, Neg liver ROS,   Endo/Other  negative endocrine ROS  Renal/GU negative Renal ROS     Musculoskeletal negative musculoskeletal ROS (+)   Abdominal   Peds  Hematology negative hematology ROS (+)   Anesthesia Other Findings   Reproductive/Obstetrics negative OB ROS                             Anesthesia Physical Anesthesia Plan  ASA: II  Anesthesia Plan: General   Post-op Pain Management:    Induction: Intravenous  PONV Risk Score and Plan: 4 or greater and Ondansetron, Dexamethasone, Midazolam and Treatment may vary due to age or medical condition  Airway Management Planned: LMA  Additional Equipment:   Intra-op Plan:   Post-operative Plan: Extubation in OR  Informed Consent: I have reviewed the patients History and Physical, chart, labs and discussed the procedure including the risks, benefits and alternatives for the proposed anesthesia with the patient or authorized representative who has indicated his/her understanding and acceptance.   Dental advisory given  Plan Discussed with: CRNA  Anesthesia Plan Comments:         Anesthesia Quick Evaluation

## 2018-05-09 NOTE — Anesthesia Postprocedure Evaluation (Signed)
Anesthesia Post Note  Patient: [name]  Procedure(s) Performed: ORCHIECTOMY (Right )     Patient location during evaluation: PACU Anesthesia Type: General Level of consciousness: sedated and patient cooperative Pain management: pain level controlled Vital Signs Assessment: post-procedure vital signs reviewed and stable Respiratory status: spontaneous breathing Cardiovascular status: stable Anesthetic complications: no    Last Vitals:  Vitals:   05/09/18 1915 05/09/18 1930  BP: (!) 141/102 129/89  Pulse: 70 62  Resp: (!) 22 15  Temp: 37.2 C   SpO2: 100% 97%    Last Pain:  Vitals:   05/09/18 1930  TempSrc:   PainSc: [place]

## 2018-05-09 NOTE — Progress Notes (Signed)
Dr Tresa Moore notifed by Kelby Aline, that pt had small area on scrotum that is open and bleeding.  M [place] cleaned are and applied glue as instructed by Dr Tresa Moore.  Dr Tresa Moore aware pt has not voided. No new orders

## 2018-05-09 NOTE — Anesthesia Procedure Notes (Signed)
Procedure Name: LMA Insertion Date/Time: 05/09/2018 5:50 PM Performed by: Lissa Morales, CRNA Pre-anesthesia Checklist: Patient identified, Emergency Drugs available, Suction available and Patient being monitored Patient Re-evaluated:Patient Re-evaluated prior to induction Oxygen Delivery Method: Circle system utilized Preoxygenation: Pre-oxygenation with 100% oxygen Induction Type: IV induction Ventilation: Mask ventilation without difficulty LMA: LMA with gastric port inserted LMA Size: 5.0 Tube type: Oral Number of attempts: 1 Airway Equipment and Method: Oral airway Placement Confirmation: positive ETCO2 Tube secured with: Tape Dental Injury: Teeth and Oropharynx as per pre-operative assessment

## 2018-05-09 NOTE — Progress Notes (Signed)
Dr Tresa Moore at bedside/pt does NOT need to void prior to discharge

## 2018-05-09 NOTE — Transfer of Care (Signed)
Immediate Anesthesia Transfer of Care Note  Patient: [name]  Procedure(s) Performed: ORCHIECTOMY (Right )  Patient Location: PACU  Anesthesia Type:General  Level of Consciousness: awake, alert , oriented and patient cooperative  Airway & Oxygen Therapy: Patient Spontanous Breathing and Patient connected to face mask  Post-op Assessment: Report given to RN and Post -op Vital signs reviewed and stable  Post vital signs: Reviewed and stable  Last Vitals:  Vitals Value Taken Time  BP 139/97 05/09/2018  6:40 PM  Temp    Pulse 70 05/09/2018  6:41 PM  Resp 17 05/09/2018  6:41 PM  SpO2 100 % 05/09/2018  6:41 PM  Vitals shown include unvalidated device data.  Last Pain:  Vitals:   05/09/18 1514  TempSrc: Oral  PainSc: 0-No pain         Complications: No apparent anesthesia complications

## 2018-05-10 ENCOUNTER — Encounter (HOSPITAL_COMMUNITY): Payer: Self-pay | Admitting: Urology

## 2018-05-10 NOTE — Op Note (Signed)
NAMEPETER, [name] MEDICAL RECORD NU:[id number] ACCOUNT [id number] DATE OF BIRTH:11-Feb-1986 FACILITY: [facility] LOCATION: [facility] PHYSICIAN:Dakwan Pridgen Tresa Moore, MD  OPERATIVE REPORT  DATE OF PROCEDURE:  05/09/2018  PREOPERATIVE DIAGNOSIS:  History of right undescended testis with right testicular mass and atrophy.  PROCEDURE:  Right radical inguinal orchiectomy.  ESTIMATED BLOOD LOSS:  Nil.  COMPLICATIONS:  None.  SPECIMENS:  Right atrophic testicle with mass, history of maldescent, permanent pathology.  FINDINGS: 1.  Atrophic right testicle with a palpable firm mass. 2.  Scarification around the area of the testicle and cord as expected with history of prior orchidopexy. 3.  Nonviolation of the scrotum or testicular tunics. 4.  Successful placement of the proximal tagged silk suture on the proximal end of the cord.  INDICATIONS:  The patient is a very pleasant 32 year old young man with history of testicular maldescent and also family history of testicular cancer.  He is status post orchidopexy years ago.  He has been vigilant with self-exams.  He was found on  recent self-exam to have to him felt like a new abnormality of his atrophic right testis.  He was seen in the office and a palpable firm area was noted.  This is highly concerning for possible neoplasm.  He underwent a scrotal ultrasound which  corroborated a hypoechoic solid hypervascular mass.  Given his constellation of findings and history of maldescent and family history of testicular cancer, it is clearly felt that right radical orchiectomy was warranted.  He has a normal contralateral  testis and no desire for future paternity.  Informed consent was obtained and placed in medical record.    DESCRIPTION OF PROCEDURE:  The patient was identified.  The procedure being a right radical orchiectomy was confirmed.  Procedure timeout was performed.  Antibiotics administered.  General LMA anesthesia induced.  The patient was placed  in the supine  position and a sterile field was created by first clipper shaving then prepping and draping the patient's right groin, penis and scrotum using iodine after clipper shaving.  The patient already had a right inguinal incision from prior orchidopexy.  An  elliptical incision was made directly over this excising the old scar and then dissecting down through [place] subcutaneous tissue towards the area of the cord, which was positively identified just outside the external ring.  The cord was mobilized  circumferentially and a half inch Penrose drain placed around this for manipulation.  This was dissected inferiorly towards the area of the scrotum.  After closure of the scrotum, as expected, there was significant scarification.  Exquisite care was  taken to dissect in a fashion that avoided any violation of the scrotal skin or intratesticular tunical compartments.  The right testis was carefully delivered in the operative field via the inguinal incision.  A very high ligation of the cord was  performed after placing 3 proximal silk ties with the most proximal one having a 3-inch tag on it and transecting the cord coldly with [employer].  The right testicle was set aside for permanent pathology.  The intrascrotal contents as well as the  operative field was carefully inspected.  Additional hemostasis was achieved with point coagulation current cautery which resulted in excellent hemostasis.  No evidence of hematoma.  No evidence of scrotal skin violation.  The incision site was  infiltrated with 20 mL of 0.25% plain Marcaine and then closed with 2 subcutaneous layers of 3-0 Vicryl and then finally a subcuticular Monocryl followed at the skin with Dermabond and the procedure  terminated.  The patient tolerated the procedure well.   No immediate perinatal complications.  The patient was taken to postanesthesia care in stable condition with plan for discharge home.  TN/NUANCE  D:05/09/2018  T:05/10/2018 JOB:[id number]

## 2018-05-31 ENCOUNTER — Telehealth: Payer: Self-pay | Admitting: Oncology

## 2018-05-31 NOTE — Telephone Encounter (Signed)
Lft the pt a vm to schedule an appt with Dr. Alen Blew

## 2018-06-04 ENCOUNTER — Encounter: Payer: Self-pay | Admitting: Oncology

## 2018-06-20 ENCOUNTER — Inpatient Hospital Stay: Payer: BLUE CROSS/BLUE SHIELD | Attending: Oncology | Admitting: Oncology

## 2018-06-20 VITALS — BP 117/79 | HR 60 | Temp 98.2°F | Resp 17 | Ht 72.0 in | Wt 187.7 lb

## 2018-06-20 DIAGNOSIS — C6291 Malignant neoplasm of right testis, unspecified whether descended or undescended: Secondary | ICD-10-CM

## 2018-06-20 DIAGNOSIS — C6201 Malignant neoplasm of undescended right testis: Secondary | ICD-10-CM | POA: Insufficient documentation

## 2018-06-20 DIAGNOSIS — Z9079 Acquired absence of other genital organ(s): Secondary | ICD-10-CM | POA: Insufficient documentation

## 2018-06-20 NOTE — Progress Notes (Signed)
Reason for the request: Testicular cancer  HPI: I was asked by Dr. Tresa Moore to evaluate [name] with recent diagnosis of cancer.  He is a pleasant 32 year old man with lived the majority of his life around this area.  He has history of an undescended testicle on the right side.  He has been conscious about to doing routine self examination and noted a hard mass on his right testicle in July 2019.  He was evaluated by an ultrasound obtained on April 11, 2018 which showed a 1.1 x 0.8 x 1.1 cm right testicular mass.  He subsequently underwent a orchiectomy completed on 05/09/2018 in the care of Dr. Tresa Moore.  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  The final pathological staging was pT1a.  No lymphovascular invasion noted at the time.  His tumor markers prior to surgery all were normal including alpha-fetoprotein and beta-hCG.  CT scan of the chest abdomen and pelvis obtained on 05/11/2018 showed no evidence of metastatic disease.  He tolerated surgery very well and fully recovered at this time.  He has regained all activities of daily living.  He denies any discharge or abdominal discomfort.  He does not report any headaches, blurry vision, syncope or seizures. Does not report any fevers, chills or sweats.  Does not report any cough, wheezing or hemoptysis.  Does not report any chest pain, palpitation, orthopnea or leg edema.  Does not report any nausea, vomiting or abdominal pain.  Does not report any constipation or diarrhea.  Does not report any skeletal complaints.    Does not report frequency, urgency or hematuria.  Does not report any skin rashes or lesions. Does not report any heat or cold intolerance.  Does not report any lymphadenopathy or petechiae.  Does not report any anxiety or depression.  Remaining review of systems is negative.    Past Medical History:  Diagnosis Date  . H/O multiple concussions   :  Past Surgical  History:  Procedure Laterality Date  . HERNIA REPAIR    . KNEE ARTHROSCOPY    . ORCHIECTOMY Right 05/09/2018   Procedure: ORCHIECTOMY;  Surgeon: Blake Frock, MD;  Location: [facility];  Service: Urology;  Laterality: Right;  :   Current Outpatient Medications:  .  cholecalciferol (VITAMIN D) 1000 units tablet, Take 1,000 Units by mouth every other day., Disp: , Rfl:  .  oxyCODONE-acetaminophen (PERCOCET) 5-325 MG tablet, Take 1-2 tablets by mouth every 6 (six) hours as needed for moderate pain or severe pain. Post-operatively, Disp: 15 tablet, Rfl: 0 .  senna-docusate (SENOKOT-S) 8.6-50 MG tablet, Take 1 tablet by mouth 2 (two) times daily. While taking strong pain meds to prevent constipation., Disp: 20 tablet, Rfl: 0 .  vitamin C (ASCORBIC ACID) 500 MG tablet, Take 500 mg by mouth every other day., Disp: , Rfl: :  Allergies  Allergen Reactions  . Naproxen Other (See Comments)    Stomach ulcer   :  No family history on file.:  Social History   Socioeconomic History  . Marital status: Married    Spouse name: Not on file  . Number of children: Not on file  . Years of education: Not on file  . Highest education level: Not on file  Occupational History  . Not on file  Social Needs  . Financial resource strain: Not on file  . Food insecurity:    Worry: Not on file    Inability: Not on  file  . Transportation needs:    Medical: Not on file    Non-medical: Not on file  Tobacco Use  . Smoking status: Never Smoker  . Smokeless tobacco: Never Used  Substance and Sexual Activity  . Alcohol use: No  . Drug use: No  . Sexual activity: Yes  Lifestyle  . Physical activity:    Days per week: Not on file    Minutes per session: Not on file  . Stress: Not on file  Relationships  . Social connections:    Talks on phone: Not on file    Gets together: Not on file    Attends religious service: Not on file    Active member of club or organization: Not on file    Attends meetings of  clubs or organizations: Not on file    Relationship status: Not on file  . Intimate partner violence:    Fear of current or ex partner: Not on file    Emotionally abused: Not on file    Physically abused: Not on file    Forced sexual activity: Not on file  Other Topics Concern  . Not on file  Social History Narrative  . Not on file  :  Pertinent items are noted in HPI.  Exam: Blood pressure 117/79, pulse 60, temperature 98.2 F (36.8 C), temperature source Oral, resp. rate 17, height 6' (1.829 m), weight 187 lb 11.2 oz (85.1 kg), SpO2 98 %.  ECOG 0 General appearance: alert and cooperative appeared without distress. Head: atraumatic without any abnormalities. Eyes: conjunctivae/corneas clear. PERRL.  Sclera anicteric. Throat: lips, mucosa, and tongue normal; without oral thrush or ulcers. Resp: clear to auscultation bilaterally without rhonchi, wheezes or dullness to percussion. Cardio: regular rate and rhythm, S1, S2 normal, no murmur, click, rub or gallop GI: soft, non-tender; bowel sounds normal; no masses,  no organomegaly Skin: Skin color, texture, turgor normal. No rashes or lesions Lymph nodes: Cervical, supraclavicular, and axillary nodes normal. Neurologic: Grossly normal without any motor, sensory or deep tendon reflexes. Musculoskeletal: No joint deformity or effusion.      Assessment and Plan:   32 year old man with the following:  1.  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  CT scan of the chest abdomen and pelvis showed no evidence of metastatic disease with negative tumor markers.  The natural course of this disease was reviewed today with the patient in detail.  His disease should be curable more than 90% of the time was orchiectomy alone.  The rationale for using additional therapy was reviewed in the form of adjuvant chemotherapy as well as adjuvant radiation therapy.  Most of  the data showed slight to benefit in the stage Ib disease but is unclear whether any benefit is documented in stage Ia.  The risks and benefits and rationale of using carboplatin chemotherapy was reviewed today.  We also discussed long-term complication associated with chemotherapy in general which include cytopenias, pulmonary disease, infusion related complications as well as secondary malignancies in the future.  Given the fact that we are dealing with early stage disease there is marginal and likely no benefit to any adjuvant therapy at this time.  The standard of care at this time would be active surveillance which I recommend that he follows.  He is willing and motivated to do so and has follow-up with Dr. Tresa Moore with repeat imaging studies in the next 6 months.  He understands he is to continue with surveillance for at least 5 years and potentially lifetime after that.  Also encouraged him to continue with self-examination as well as be vigilant about any abnormalities in his left testicle.  2.  Age-appropriate cancer screening: He is up-to-date at this time and well informed.  3.  Follow-up: I am happy to see him in the future as needed.  He opted to continue with active surveillance with Dr. Tresa Moore.  60  minutes was spent with the patient face-to-face today.  More than 50% of time was dedicated to reviewing the natural course of his disease, reviewing imaging studies and discussing the risks and benefits of treatment options.   Thank you for the referral. A copy of this consult has been forwarded to the requesting physician.

## 2018-06-21 ENCOUNTER — Telehealth: Payer: Self-pay

## 2018-06-21 NOTE — Telephone Encounter (Signed)
Per 9/26 no los

## 2018-07-05 ENCOUNTER — Telehealth: Payer: Self-pay | Admitting: Oncology

## 2018-07-05 NOTE — Telephone Encounter (Signed)
Faxed medical records to [facility], Release ID: [id number]

## 2018-10-29 ENCOUNTER — Other Ambulatory Visit: Payer: Self-pay | Admitting: Oncology

## 2018-10-29 ENCOUNTER — Telehealth: Payer: Self-pay | Admitting: Oncology

## 2018-10-29 DIAGNOSIS — C6291 Malignant neoplasm of right testis, unspecified whether descended or undescended: Secondary | ICD-10-CM

## 2018-10-29 NOTE — Telephone Encounter (Signed)
Returned call to patient re scheduling an appointment with Dr. Alen Blew. Patient last seen 06/21/2018 and no f/u orders given. Per patient he saw the urologist, had surgery, then saw Dr. Alen Blew and the recommendation was screening follow up that could be done with either the urologist or oncologist. Per patient after discussion with his wife they would prefer that screening follow up be done with Dr. Alen Blew. Message routed to Dr. Alen Blew.

## 2018-10-29 NOTE — Telephone Encounter (Signed)
Please let him know that he will need lab + CT on 3/3. MD on 3/10 at 12:00 pm.   Melissa: can you schedule him please? Order are in.

## 2018-10-30 ENCOUNTER — Telehealth: Payer: Self-pay | Admitting: Oncology

## 2018-10-30 NOTE — Telephone Encounter (Signed)
Appointments scheduled as requested per Dr. Alen Blew for lab 3/3 and f/u 3/10 @ 12 pm - see previous note. Central radiology will call re scan. Left message for patient. Schedule mailed.

## 2018-11-27 ENCOUNTER — Inpatient Hospital Stay: Payer: BLUE CROSS/BLUE SHIELD | Attending: Oncology

## 2018-11-27 ENCOUNTER — Ambulatory Visit (HOSPITAL_COMMUNITY)
Admission: RE | Admit: 2018-11-27 | Discharge: 2018-11-27 | Disposition: A | Discharge status: Home / Self Care | Payer: BLUE CROSS/BLUE SHIELD | Source: Ambulatory Visit | Attending: Oncology | Admitting: Oncology

## 2018-11-27 DIAGNOSIS — Z8547 Personal history of malignant neoplasm of testis: Secondary | ICD-10-CM | POA: Diagnosis not present

## 2018-11-27 DIAGNOSIS — C6291 Malignant neoplasm of right testis, unspecified whether descended or undescended: Secondary | ICD-10-CM

## 2018-11-27 DIAGNOSIS — Z79899 Other long term (current) drug therapy: Secondary | ICD-10-CM | POA: Diagnosis not present

## 2018-11-27 DIAGNOSIS — Z9079 Acquired absence of other genital organ(s): Secondary | ICD-10-CM | POA: Insufficient documentation

## 2018-11-27 DIAGNOSIS — R918 Other nonspecific abnormal finding of lung field: Secondary | ICD-10-CM | POA: Diagnosis not present

## 2018-11-27 LAB — CBC WITH DIFFERENTIAL (CANCER CENTER ONLY)
Abs Immature Granulocytes: 0.01 10*3/uL (ref 0.00–0.07)
BASOS ABS: 0.1 10*3/uL (ref 0.0–0.1)
Basophils Relative: 1 %
EOS ABS: 0.1 10*3/uL (ref 0.0–0.5)
Eosinophils Relative: 1 %
HCT: 46.7 % (ref 39.0–52.0)
Hemoglobin: 15.9 g/dL (ref 13.0–17.0)
IMMATURE GRANULOCYTES: 0 %
Lymphocytes Relative: 45 %
Lymphs Abs: 2.5 10*3/uL (ref 0.7–4.0)
MCH: 29.9 pg (ref 26.0–34.0)
MCHC: 34 g/dL (ref 30.0–36.0)
MCV: 87.8 fL (ref 80.0–100.0)
MONOS PCT: 8 %
Monocytes Absolute: 0.4 10*3/uL (ref 0.1–1.0)
NEUTROS PCT: 45 %
NRBC: 0 % (ref 0.0–0.2)
Neutro Abs: 2.6 10*3/uL (ref 1.7–7.7)
PLATELETS: 222 10*3/uL (ref 150–400)
RBC: 5.32 MIL/uL (ref 4.22–5.81)
RDW: 12.5 % (ref 11.5–15.5)
WBC Count: 5.7 10*3/uL (ref 4.0–10.5)

## 2018-11-27 LAB — CMP (CANCER CENTER ONLY)
ALK PHOS: 75 U/L (ref 38–126)
ALT: 24 U/L (ref 0–44)
ANION GAP: 10 (ref 5–15)
AST: 19 U/L (ref 15–41)
Albumin: 4.8 g/dL (ref 3.5–5.0)
BILIRUBIN TOTAL: 1 mg/dL (ref 0.3–1.2)
BUN: 13 mg/dL (ref 6–20)
CALCIUM: 9.4 mg/dL (ref 8.9–10.3)
CO2: 26 mmol/L (ref 22–32)
Chloride: 105 mmol/L (ref 98–111)
Creatinine: 1.06 mg/dL (ref 0.61–1.24)
GFR, Estimated: >60 mL/min (ref >60)
Glucose, Bld: 92 mg/dL (ref 70–99)
Potassium: 3.9 mmol/L (ref 3.5–5.1)
Sodium: 141 mmol/L (ref 135–145)
Total Protein: 7.6 g/dL (ref 6.5–8.1)

## 2018-11-27 LAB — LACTATE DEHYDROGENASE: LDH: 135 U/L (ref 98–192)

## 2018-11-27 MED ORDER — SODIUM CHLORIDE (PF) 0.9 % IJ SOLN
INTRAMUSCULAR | Status: AC
  Filled 2018-11-27: qty 50

## 2018-11-27 MED ORDER — IOHEXOL 300 MG/ML  SOLN
100.0000 mL | Freq: Once | INTRAMUSCULAR | Status: AC | PRN
  Administered 2018-11-27: 100 mL via INTRAVENOUS

## 2018-11-28 LAB — AFP TUMOR MARKER: AFP, SERUM, TUMOR MARKER: 3 ng/mL (ref 0.0–8.3)

## 2018-11-28 LAB — BETA HCG QUANT (REF LAB): hCG Quant: <1 m[IU]/mL (ref 0–3)

## 2018-12-04 ENCOUNTER — Inpatient Hospital Stay: Payer: BLUE CROSS/BLUE SHIELD | Admitting: Oncology

## 2018-12-04 ENCOUNTER — Telehealth: Payer: Self-pay | Admitting: Oncology

## 2018-12-04 VITALS — BP 117/72 | HR 65 | Temp 97.8°F | Resp 18 | Ht 72.0 in | Wt 195.9 lb

## 2018-12-04 DIAGNOSIS — Z79899 Other long term (current) drug therapy: Secondary | ICD-10-CM | POA: Diagnosis not present

## 2018-12-04 DIAGNOSIS — R918 Other nonspecific abnormal finding of lung field: Secondary | ICD-10-CM | POA: Diagnosis not present

## 2018-12-04 DIAGNOSIS — Z8547 Personal history of malignant neoplasm of testis: Secondary | ICD-10-CM

## 2018-12-04 DIAGNOSIS — C6291 Malignant neoplasm of right testis, unspecified whether descended or undescended: Secondary | ICD-10-CM

## 2018-12-04 DIAGNOSIS — Z9079 Acquired absence of other genital organ(s): Secondary | ICD-10-CM

## 2018-12-04 NOTE — Progress Notes (Signed)
Hematology and Oncology Follow Up Visit  [name] [id number] 12/31/1985 33 y.o. 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   Prior Therapy: He is status post orchiectomy on 05/09/2018. 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  The final pathological staging was pT1a.  No lymphovascular invasion noted at the time.  Current therapy: Active surveillance.  Interim History: [name] returns today for repeat evaluation.  Since last visit, he reports no major changes in his health.  He remains active and continues to work full-time.  He denies any abdominal discomfort, testicular masses or recent hospitalization.  His quality of life remains about the same.  He does not report any headaches, blurry vision, syncope or seizures. Does not report any fevers, chills or sweats.  Does not report any cough, wheezing or hemoptysis.  Does not report any chest pain, palpitation, orthopnea or leg edema.  Does not report any nausea, vomiting or abdominal pain.  Does not report any constipation or diarrhea.  Does not report any skeletal complaints.    Does not report frequency, urgency or hematuria.  Does not report any skin rashes or lesions. Does not report any heat or cold intolerance.  Does not report any lymphadenopathy or petechiae.  Does not report any anxiety or depression.  Remaining review of systems is negative.    Medications: I have reviewed the patient's current medications.  Current Outpatient Medications  Medication Sig Dispense Refill  . cholecalciferol (VITAMIN D) 1000 units tablet Take 1,000 Units by mouth every other day.    . oxyCODONE-acetaminophen (PERCOCET) 5-325 MG tablet Take 1-2 tablets by mouth every 6 (six) hours as needed for moderate pain or severe pain. Post-operatively 15 tablet 0   No current  facility-administered medications for this visit.      Allergies: No Known Allergies  Past Medical History, Surgical history, Social history, and Family History were reviewed and updated.    Physical Exam: Blood pressure 117/72, pulse 65, temperature 97.8 F (36.6 C), temperature source Oral, resp. rate 18, height 6' (1.829 m), weight 195 lb 14.4 oz (88.9 kg), SpO2 98 %.  ECOG: 0 General appearance: alert and cooperative appeared without distress. Head: Normocephalic, without obvious abnormality Oropharynx: No oral thrush or ulcers. Eyes: No scleral icterus.  Pupils are equal and round reactive to light. Lymph nodes: Cervical, supraclavicular, and axillary nodes normal. Heart:regular rate and rhythm, S1, S2 normal, no murmur, click, rub or gallop Lung:chest clear, no wheezing, rales, normal symmetric air entry Abdomin: soft, non-tender, without masses or organomegaly. Neurological: No motor, sensory deficits.  Intact deep tendon reflexes. Skin: No rashes or lesions.  No ecchymosis or petechiae. Musculoskeletal: No joint deformity or effusion. Psychiatric: Mood and affect are appropriate.    Lab Results: Lab Results  Component Value Date   WBC 5.7 11/27/2018   HGB 15.9 11/27/2018   HCT 46.7 11/27/2018   MCV 87.8 11/27/2018   PLT 222 11/27/2018     Chemistry      Component Value Date/Time   NA 141 11/27/2018 1332   K 3.9 11/27/2018 1332   CL 105 11/27/2018 1332   CO2 26 11/27/2018 1332   BUN 13 11/27/2018 1332   CREATININE 1.06 11/27/2018 1332      Component Value Date/Time   CALCIUM 9.4 11/27/2018 1332   ALKPHOS 75 11/27/2018 1332   AST  19 11/27/2018 1332   ALT 24 11/27/2018 1332   BILITOT 1.0 11/27/2018 1332       Radiological Studies: EXAM: CT CHEST, ABDOMEN, AND PELVIS WITH CONTRAST  TECHNIQUE: Multidetector CT imaging of the chest, abdomen and pelvis was performed following the standard protocol during bolus administration of intravenous  contrast.  CONTRAST:  190mL OMNIPAQUE IOHEXOL 300 MG/ML  SOLN  COMPARISON:  05/11/2018  FINDINGS: CT CHEST FINDINGS  Cardiovascular: No significant vascular findings. Normal heart size. No pericardial effusion.  Mediastinum/Nodes: No enlarged axillary or supraclavicular lymph nodes. No mediastinal or hilar adenopathy identified. Normal appearance of the thyroid gland. The trachea appears patent and is midline. Normal appearance of the esophagus.  Lungs/Pleura: No pleural effusion identified. Small nodule within the right middle lobe measures 4 mm and is unchanged from previous exam.  Musculoskeletal: No chest wall mass or suspicious bone lesions identified.  CT ABDOMEN PELVIS FINDINGS  Hepatobiliary: No focal liver abnormality is seen. No gallstones, gallbladder wall thickening, or biliary dilatation.  Pancreas: Unremarkable. No pancreatic ductal dilatation or surrounding inflammatory changes.  Spleen: Normal in size without focal abnormality.  Adrenals/Urinary Tract: Normal appearance of the adrenal glands. The kidneys are unremarkable. No mass or hydronephrosis. The urinary bladder appears normal.  Stomach/Bowel: Stomach is within normal limits. Appendix appears normal. No evidence of bowel wall thickening, distention, or inflammatory changes.  Vascular/Lymphatic: Normal appearance of the abdominal aorta. No aneurysm. No retroperitoneal adenopathy. No mesenteric adenopathy. No enlarged pelvic or inguinal lymph nodes identified.  Reproductive: Prostate is unremarkable.  Other: No abdominal wall hernia or abnormality. No abdominopelvic ascites.  Musculoskeletal: No acute or significant osseous findings.  IMPRESSION: 1. Stable CT of the chest, abdomen and pelvis. No specific findings identified to suggest metastatic disease. 2. Stable 4 mm right middle lobe lung nodule, nonspecific.  Impression and Plan:  33 year old man with:  1.     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  He is status post orchiectomy without any evidence of recurrent disease.    He is currently on active surveillance without any suggestion of disease relapse.  Imaging studies and tumor markers obtained on November 27, 2018 were personally reviewed and discussed with the patient.  At this time I recommended continued active surveillance and repeat laboratory testing and imaging studies in 6 months.  This will be completed every 6 months to complete 2 years and annually after that.  2.    Pulmonary nodule: Appears benign without any evidence to suggest malignancy.  The differential diagnosis and management options were reviewed today in detail and no additional testing at this time is needed other than repeat imaging studies in 6 months.  3.  Follow-up: 6 months for repeat surveillance imaging and laboratory testing.  15 minutes was spent with the patient face-to-face today.  More than 50% of time was dedicated to discussing laboratory data, imaging studies and answering questions regarding these findings and future plan of care.   Zola Button, MD 3/10/202011:56 AM

## 2018-12-04 NOTE — Telephone Encounter (Signed)
Scheduled per los. No printout needed ° °

## 2019-05-30 ENCOUNTER — Ambulatory Visit (HOSPITAL_COMMUNITY)
Admission: RE | Admit: 2019-05-30 | Discharge: 2019-05-30 | Disposition: A | Discharge status: Home / Self Care | Payer: BC Managed Care – PPO | Source: Ambulatory Visit | Attending: Oncology | Admitting: Oncology

## 2019-05-30 ENCOUNTER — Encounter (HOSPITAL_COMMUNITY): Payer: Self-pay

## 2019-05-30 ENCOUNTER — Inpatient Hospital Stay: Payer: BC Managed Care – PPO | Attending: Oncology

## 2019-05-30 ENCOUNTER — Other Ambulatory Visit: Payer: Self-pay

## 2019-05-30 DIAGNOSIS — Z8547 Personal history of malignant neoplasm of testis: Secondary | ICD-10-CM | POA: Diagnosis present

## 2019-05-30 DIAGNOSIS — K4090 Unilateral inguinal hernia, without obstruction or gangrene, not specified as recurrent: Secondary | ICD-10-CM | POA: Insufficient documentation

## 2019-05-30 DIAGNOSIS — Z9079 Acquired absence of other genital organ(s): Secondary | ICD-10-CM | POA: Insufficient documentation

## 2019-05-30 DIAGNOSIS — C6291 Malignant neoplasm of right testis, unspecified whether descended or undescended: Secondary | ICD-10-CM | POA: Insufficient documentation

## 2019-05-30 LAB — CBC WITH DIFFERENTIAL (CANCER CENTER ONLY)
Abs Immature Granulocytes: 0.01 10*3/uL (ref 0.00–0.07)
Basophils Absolute: 0.1 10*3/uL (ref 0.0–0.1)
Basophils Relative: 1 %
Eosinophils Absolute: 0 10*3/uL (ref 0.0–0.5)
Eosinophils Relative: 1 %
HCT: 48.4 % (ref 39.0–52.0)
Hemoglobin: 16.6 g/dL (ref 13.0–17.0)
Immature Granulocytes: 0 %
Lymphocytes Relative: 34 %
Lymphs Abs: 2 10*3/uL (ref 0.7–4.0)
MCH: 29.5 pg (ref 26.0–34.0)
MCHC: 34.3 g/dL (ref 30.0–36.0)
MCV: 86.1 fL (ref 80.0–100.0)
Monocytes Absolute: 0.4 10*3/uL (ref 0.1–1.0)
Monocytes Relative: 6 %
Neutro Abs: 3.3 10*3/uL (ref 1.7–7.7)
Neutrophils Relative %: 58 %
Platelet Count: 233 10*3/uL (ref 150–400)
RBC: 5.62 MIL/uL (ref 4.22–5.81)
RDW: 12.3 % (ref 11.5–15.5)
WBC Count: 5.8 10*3/uL (ref 4.0–10.5)
nRBC: 0 % (ref 0.0–0.2)

## 2019-05-30 LAB — CMP (CANCER CENTER ONLY)
ALT: 25 U/L (ref 0–44)
AST: 21 U/L (ref 15–41)
Albumin: 5 g/dL (ref 3.5–5.0)
Alkaline Phosphatase: 76 U/L (ref 38–126)
Anion gap: 10 (ref 5–15)
BUN: 11 mg/dL (ref 6–20)
CO2: 23 mmol/L (ref 22–32)
Calcium: 9.6 mg/dL (ref 8.9–10.3)
Chloride: 107 mmol/L (ref 98–111)
Creatinine: 1.01 mg/dL (ref 0.61–1.24)
GFR, Est AFR Am: >60 mL/min (ref >60)
GFR, Estimated: >60 mL/min (ref >60)
Glucose, Bld: 98 mg/dL (ref 70–99)
Potassium: 4 mmol/L (ref 3.5–5.1)
Sodium: 140 mmol/L (ref 135–145)
Total Bilirubin: 1.1 mg/dL (ref 0.3–1.2)
Total Protein: 7.6 g/dL (ref 6.5–8.1)

## 2019-05-30 LAB — LACTATE DEHYDROGENASE: LDH: 146 U/L (ref 98–192)

## 2019-05-30 MED ORDER — SODIUM CHLORIDE (PF) 0.9 % IJ SOLN
INTRAMUSCULAR | Status: AC
  Filled 2019-05-30: qty 50

## 2019-05-30 MED ORDER — IOHEXOL 300 MG/ML  SOLN
100.0000 mL | Freq: Once | INTRAMUSCULAR | Status: AC | PRN
  Administered 2019-05-30: 100 mL via INTRAVENOUS

## 2019-05-31 ENCOUNTER — Telehealth: Payer: Self-pay | Admitting: Oncology

## 2019-05-31 LAB — AFP TUMOR MARKER: AFP, Serum, Tumor Marker: 3 ng/mL (ref 0.0–8.3)

## 2019-05-31 LAB — BETA HCG QUANT (REF LAB): hCG Quant: <1 m[IU]/mL (ref 0–3)

## 2019-05-31 NOTE — Telephone Encounter (Signed)
Returned patient's phone call regarding appointment on 09/08, left a voicemail.

## 2019-06-04 ENCOUNTER — Inpatient Hospital Stay: Payer: BC Managed Care – PPO | Admitting: Oncology

## 2019-06-04 ENCOUNTER — Telehealth: Payer: Self-pay | Admitting: Oncology

## 2019-06-04 NOTE — Telephone Encounter (Signed)
Patient called to reschedule 09/08, per patient's request appointment has been rescheduled to 09/10.

## 2019-06-06 ENCOUNTER — Other Ambulatory Visit: Payer: Self-pay

## 2019-06-06 ENCOUNTER — Inpatient Hospital Stay: Payer: BC Managed Care – PPO | Admitting: Oncology

## 2019-06-06 VITALS — BP 117/97 | HR 68 | Temp 98.3°F | Resp 18 | Ht 72.0 in | Wt 192.2 lb

## 2019-06-06 DIAGNOSIS — C6291 Malignant neoplasm of right testis, unspecified whether descended or undescended: Secondary | ICD-10-CM | POA: Diagnosis not present

## 2019-06-06 DIAGNOSIS — Z8547 Personal history of malignant neoplasm of testis: Secondary | ICD-10-CM | POA: Diagnosis not present

## 2019-06-06 NOTE — Progress Notes (Signed)
Hematology and Oncology Follow Up Visit  [name] [id number] 24-Dec-1985 33 y.o. 06/06/2019 9:33 AM [name], MDKelly, [name], MD   Principle Diagnosis: 34 year old man with right right testicular cancer diagnosed in August 2019.  He was found to have stage IA seminoma.    Prior Therapy: He is status post orchiectomy on 05/09/2018. 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  The final pathological staging was pT1a.  No lymphovascular invasion noted at the time.  Current therapy: Active surveillance.  Interim History: [name] is here for a follow-up.  Since the last visit, he reports no major changes in his health.  He continues to be without any symptoms related to his testicular cancer.  He denies any abdominal pain or discomfort.  Denies any nausea or fatigue.  He denies any masses or lesions.  He denies any testicular lesions or discharge.  Patient denied any alteration mental status, neuropathy, confusion or dizziness.  Denies any headaches or lethargy.  Denies any night sweats, weight loss or changes in appetite.  Denied orthopnea, dyspnea on exertion or chest discomfort.  Denies shortness of breath, difficulty breathing hemoptysis or cough.  Denies any abdominal distention, nausea, early satiety or dyspepsia.  Denies any hematuria, frequency, dysuria or nocturia.  Denies any skin irritation, dryness or rash.  Denies any ecchymosis or petechiae.  Denies any lymphadenopathy or clotting.  Denies any heat or cold intolerance.  Denies any anxiety or depression.  Remaining review of system is negative.        Medications: Updated on review. Current Outpatient Medications  Medication Sig Dispense Refill  . Ascorbic Acid (VITAMIN C) 1000 MG tablet Take 1,000 mg by mouth daily.    Marland Kitchen VITAMIN D, ERGOCALCIFEROL, PO Take 500 mg by mouth daily.     No current facility-administered medications for this  visit.      Allergies: No Known Allergies  Past Medical History, Surgical history, Social history, and Family History unchanged on review.    Physical Exam: Blood pressure (!) 117/97, pulse 68, temperature 98.3 F (36.8 C), temperature source Oral, resp. rate 18, height 6' (1.829 m), weight 192 lb 3.2 oz (87.2 kg), SpO2 99 %.   ECOG: 0   General appearance: Comfortable appearing without any discomfort Head: Normocephalic without any trauma Oropharynx: Mucous membranes are moist and pink without any thrush or ulcers. Eyes: Pupils are equal and round reactive to light. Lymph nodes: No cervical, supraclavicular, inguinal or axillary lymphadenopathy.   Heart:regular rate and rhythm.  S1 and S2 without leg edema. Lung: Clear without any rhonchi or wheezes.  No dullness to percussion. Abdomin: Soft, nontender, nondistended with good bowel sounds.  No hepatosplenomegaly. Musculoskeletal: No joint deformity or effusion.  Full range of motion noted. Neurological: No deficits noted on motor, sensory and deep tendon reflex exam. Skin: No petechial rash or dryness.  Appeared moist.     Lab Results: Lab Results  Component Value Date   WBC 5.8 05/30/2019   HGB 16.6 05/30/2019   HCT 48.4 05/30/2019   MCV 86.1 05/30/2019   PLT 233 05/30/2019     Chemistry      Component Value Date/Time   NA 140 05/30/2019 1305   K 4.0 05/30/2019 1305   CL 107 05/30/2019 1305   CO2 23 05/30/2019 1305   BUN 11 05/30/2019 1305   CREATININE 1.01 05/30/2019 1305      Component Value Date/Time   CALCIUM  9.6 05/30/2019 1305   ALKPHOS 76 05/30/2019 1305   AST 21 05/30/2019 1305   ALT 25 05/30/2019 1305   BILITOT 1.1 05/30/2019 1305     EXAM: CT CHEST, ABDOMEN, AND PELVIS WITH CONTRAST  TECHNIQUE: Multidetector CT imaging of the chest, abdomen and pelvis was performed following the standard protocol during bolus administration of intravenous contrast.  CONTRAST:  145mL OMNIPAQUE IOHEXOL  300 MG/ML  SOLN  COMPARISON:  11/27/2018  FINDINGS: CT CHEST FINDINGS  Cardiovascular: No acute findings.  Mediastinum/Lymph Nodes: No masses or pathologically enlarged lymph nodes identified.  Lungs/Pleura: No pulmonary infiltrate or mass identified. No effusion present.  Musculoskeletal:  No suspicious bone lesions identified.  CT ABDOMEN AND PELVIS FINDINGS  Hepatobiliary: No masses identified. Gallbladder is unremarkable. No evidence of biliary ductal dilatation.  Pancreas:  No mass or inflammatory changes.  Spleen:  Within normal limits in size and appearance.  Adrenals/Urinary tract:  No masses or hydronephrosis.  Stomach/Bowel: No evidence of obstruction, inflammatory process, or abnormal fluid collections.  Vascular/Lymphatic: No pathologically enlarged lymph nodes identified. No abdominal aortic aneurysm.  Reproductive:  Stable postop changes from right orchiectomy.  Other: Small left inguinal hernia again seen which contains only fat.  Musculoskeletal: No suspicious bone lesions identified. 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  IMPRESSION: Stable exam. No evidence of metastatic disease within the chest, abdomen, or pelvis.  Stable small left inguinal hernia containing only fat.     Impression and Plan:  33 year old man with:  1.    Right testicular seminoma diagnosed in August 2019.  He was found to have stage IA after orchiectomy.  He remains on active surveillance without any evidence to suggest recurrent disease.  Laboratory data as well as CT scan obtained on 05/30/2019 were personally reviewed and discussed with him today.  The natural course of this disease and risk of relapse was assessed and remains at risk for at least 5 years.  The plan is to continue with imaging studies every 6 months complete 2 years and annually to complete 5 years.  He is agreeable to proceed.  2.     Pulmonary nodule: Resolved at this time.  3.  Follow-up: In 6 months for repeat evaluation.  15 minutes was spent with the patient face-to-face today.  More than 50% of time was spent on reviewing his disease status, reviewing imaging studies and answering questions regarding future plan of care.   Zola Button, MD 9/10/20209:33 AM

## 2019-06-07 ENCOUNTER — Telehealth: Payer: Self-pay | Admitting: Oncology

## 2019-06-07 NOTE — Telephone Encounter (Signed)
Called and left msg. Mailed printout  °

## 2019-11-27 ENCOUNTER — Inpatient Hospital Stay: Payer: BC Managed Care – PPO

## 2019-11-27 ENCOUNTER — Ambulatory Visit (HOSPITAL_COMMUNITY): Payer: BLUE CROSS/BLUE SHIELD

## 2019-12-04 ENCOUNTER — Inpatient Hospital Stay: Payer: BLUE CROSS/BLUE SHIELD | Attending: Oncology | Admitting: Oncology

## 2020-01-20 ENCOUNTER — Inpatient Hospital Stay: Payer: BLUE CROSS/BLUE SHIELD | Attending: Oncology

## 2020-01-20 ENCOUNTER — Other Ambulatory Visit: Payer: Self-pay

## 2020-01-20 DIAGNOSIS — Z9079 Acquired absence of other genital organ(s): Secondary | ICD-10-CM | POA: Insufficient documentation

## 2020-01-20 DIAGNOSIS — Z8547 Personal history of malignant neoplasm of testis: Secondary | ICD-10-CM | POA: Diagnosis present

## 2020-01-20 DIAGNOSIS — R918 Other nonspecific abnormal finding of lung field: Secondary | ICD-10-CM | POA: Insufficient documentation

## 2020-01-20 DIAGNOSIS — C6291 Malignant neoplasm of right testis, unspecified whether descended or undescended: Secondary | ICD-10-CM

## 2020-01-20 LAB — CMP (CANCER CENTER ONLY)
ALT: 14 U/L (ref 0–44)
AST: 14 U/L — ABNORMAL LOW (ref 15–41)
Albumin: 4.4 g/dL (ref 3.5–5.0)
Alkaline Phosphatase: 64 U/L (ref 38–126)
Anion gap: 10 (ref 5–15)
BUN: 14 mg/dL (ref 6–20)
CO2: 27 mmol/L (ref 22–32)
Calcium: 9.1 mg/dL (ref 8.9–10.3)
Chloride: 104 mmol/L (ref 98–111)
Creatinine: 0.99 mg/dL (ref 0.61–1.24)
GFR, Est AFR Am: >60 mL/min (ref >60)
GFR, Estimated: >60 mL/min (ref >60)
Glucose, Bld: 100 mg/dL — ABNORMAL HIGH (ref 70–99)
Potassium: 4.1 mmol/L (ref 3.5–5.1)
Sodium: 141 mmol/L (ref 135–145)
Total Bilirubin: 1.2 mg/dL (ref 0.3–1.2)
Total Protein: 7.1 g/dL (ref 6.5–8.1)

## 2020-01-20 LAB — CBC WITH DIFFERENTIAL (CANCER CENTER ONLY)
Abs Immature Granulocytes: 0.02 10*3/uL (ref 0.00–0.07)
Basophils Absolute: 0 10*3/uL (ref 0.0–0.1)
Basophils Relative: 1 %
Eosinophils Absolute: 0.1 10*3/uL (ref 0.0–0.5)
Eosinophils Relative: 2 %
HCT: 45.9 % (ref 39.0–52.0)
Hemoglobin: 15.9 g/dL (ref 13.0–17.0)
Immature Granulocytes: 0 %
Lymphocytes Relative: 33 %
Lymphs Abs: 1.8 10*3/uL (ref 0.7–4.0)
MCH: 30.1 pg (ref 26.0–34.0)
MCHC: 34.6 g/dL (ref 30.0–36.0)
MCV: 86.8 fL (ref 80.0–100.0)
Monocytes Absolute: 0.5 10*3/uL (ref 0.1–1.0)
Monocytes Relative: 9 %
Neutro Abs: 3 10*3/uL (ref 1.7–7.7)
Neutrophils Relative %: 55 %
Platelet Count: 230 10*3/uL (ref 150–400)
RBC: 5.29 MIL/uL (ref 4.22–5.81)
RDW: 12 % (ref 11.5–15.5)
WBC Count: 5.4 10*3/uL (ref 4.0–10.5)
nRBC: 0 % (ref 0.0–0.2)

## 2020-01-20 LAB — LACTATE DEHYDROGENASE: LDH: 131 U/L (ref 98–192)

## 2020-01-21 LAB — BETA HCG QUANT (REF LAB): hCG Quant: <1 m[IU]/mL (ref 0–3)

## 2020-01-21 LAB — AFP TUMOR MARKER: AFP, Serum, Tumor Marker: 2.5 ng/mL (ref 0.0–8.3)

## 2020-01-23 ENCOUNTER — Ambulatory Visit
Admission: RE | Admit: 2020-01-23 | Discharge: 2020-01-23 | Disposition: A | Discharge status: Home / Self Care | Payer: BLUE CROSS/BLUE SHIELD | Source: Ambulatory Visit | Attending: Oncology | Admitting: Oncology

## 2020-01-23 ENCOUNTER — Other Ambulatory Visit: Payer: Self-pay

## 2020-01-23 DIAGNOSIS — C6291 Malignant neoplasm of right testis, unspecified whether descended or undescended: Secondary | ICD-10-CM

## 2020-01-23 MED ORDER — IOPAMIDOL (ISOVUE-300) INJECTION 61%
100.0000 mL | Freq: Once | INTRAVENOUS | Status: AC | PRN
  Administered 2020-01-23: 09:00:00 100 mL via INTRAVENOUS

## 2020-01-24 ENCOUNTER — Other Ambulatory Visit: Payer: Self-pay

## 2020-01-24 ENCOUNTER — Inpatient Hospital Stay (HOSPITAL_BASED_OUTPATIENT_CLINIC_OR_DEPARTMENT_OTHER): Payer: BLUE CROSS/BLUE SHIELD | Admitting: Oncology

## 2020-01-24 VITALS — BP 140/88 | HR 65 | Temp 98.7°F | Resp 20 | Ht 72.0 in | Wt 195.9 lb

## 2020-01-24 DIAGNOSIS — Z8547 Personal history of malignant neoplasm of testis: Secondary | ICD-10-CM | POA: Diagnosis not present

## 2020-01-24 DIAGNOSIS — C6291 Malignant neoplasm of right testis, unspecified whether descended or undescended: Secondary | ICD-10-CM | POA: Diagnosis not present

## 2020-01-24 NOTE — Progress Notes (Signed)
Hematology and Oncology Follow Up Visit  [name] [id number] 12-Jun-1986 34 y.o. 01/24/2020 4:03 PM [name], MDKelly, [name], MD   Principle Diagnosis: 34 year old man with stage Ia seminoma of the right testicle diagnosed in August 2019.  He was found to have stage IA seminoma.    Prior Therapy: He is status post orchiectomy on 05/09/2018. 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  The final pathological staging was pT1a.  No lymphovascular invasion noted at the time.  Current therapy: Active surveillance.  Interim History: [name] is here for return evaluation.  Since the last visit, he reports no major changes in his health.  He denies any recent hospitalization or illnesses.  He denies any abdominal pain, weight loss or appetite changes.  He did notice a small subcutaneous nodule on his thigh.  Continues to be active and attends activities of daily living including exercising regularly.        Medications: Reviewed without changes. Current Outpatient Medications  Medication Sig Dispense Refill  . Ascorbic Acid (VITAMIN C) 1000 MG tablet Take 1,000 mg by mouth daily.    Marland Kitchen VITAMIN D, ERGOCALCIFEROL, PO Take 500 mg by mouth daily.     No current facility-administered medications for this visit.     Allergies: No Known Allergies      Physical Exam: Blood pressure 140/88, pulse 65, temperature 98.7 F (37.1 C), temperature source Temporal, resp. rate 20, height 6' (1.829 m), weight 195 lb 14.4 oz (88.9 kg), SpO2 100 %.   ECOG: 0    General appearance: Alert, awake without any distress. Head: Atraumatic without abnormalities Oropharynx: Without any thrush or ulcers. Eyes: No scleral icterus. Lymph nodes: No lymphadenopathy noted in the cervical, supraclavicular, or axillary nodes Heart:regular rate and rhythm, without any murmurs or gallops.   Lung: Clear to auscultation  without any rhonchi, wheezes or dullness to percussion. Abdomin: Soft, nontender without any shifting dullness or ascites. Musculoskeletal: No clubbing or cyanosis. Neurological: No motor or sensory deficits. Skin: No rashes or lesions.     Lab Results: Lab Results  Component Value Date   WBC 5.4 01/20/2020   HGB 15.9 01/20/2020   HCT 45.9 01/20/2020   MCV 86.8 01/20/2020   PLT 230 01/20/2020     Chemistry      Component Value Date/Time   NA 141 01/20/2020 0749   K 4.1 01/20/2020 0749   CL 104 01/20/2020 0749   CO2 27 01/20/2020 0749   BUN 14 01/20/2020 0749   CREATININE 0.99 01/20/2020 0749      Component Value Date/Time   CALCIUM 9.1 01/20/2020 0749   ALKPHOS 64 01/20/2020 0749   AST 14 (L) 01/20/2020 0749   ALT 14 01/20/2020 0749   BILITOT 1.2 01/20/2020 0749      IMPRESSION: 1. No findings to suggest metastatic disease in the chest, abdomen or pelvis. 2. Incidental findings, as above.   Impression and Plan:  34 year old man with:  1.    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   He status post orchiectomy and continues to be on active surveillance at this time.  CT scan obtained on January 23, 2020 was personally reviewed and discussed with the patient.  He has no evidence to suggest metastatic disease based on the CT scan.  Laboratory data were also reviewed and showed normal tumor markers.  The natural course of this disease and the risk of  relapse was assessed at this time.  I recommended continued active surveillance given the risk of late relapse associated with seminoma.  Salvage therapy options in the future were reviewed.  This therapy include include radiation therapy as well as systemic chemo therapy.  I recommended repeating annually after that to complete 5 years.  2.    Pulmonary nodule: Appears to be benign at this time with CT scan showed no evidence of malignancy.  3.  Follow-up: He will return in 6 months for repeat  evaluation.   30  minutes were spent on this encounter.  The time was spent on reviewing imaging studies, laboratory data, future treatment options and future plan of care discussion.    Zola Button, MD 4/30/20214:03 PM

## 2020-01-27 ENCOUNTER — Telehealth: Payer: Self-pay | Admitting: Oncology

## 2020-01-27 NOTE — Telephone Encounter (Signed)
Scheduled appt per 4/30 los.  Left a vm of the appt date and time.  

## 2020-07-24 ENCOUNTER — Inpatient Hospital Stay: Payer: BLUE CROSS/BLUE SHIELD

## 2020-07-31 ENCOUNTER — Ambulatory Visit: Payer: BLUE CROSS/BLUE SHIELD | Admitting: Oncology

## 2020-08-07 ENCOUNTER — Other Ambulatory Visit: Payer: Self-pay

## 2020-08-07 ENCOUNTER — Inpatient Hospital Stay: Payer: BLUE CROSS/BLUE SHIELD | Attending: Oncology

## 2020-08-07 DIAGNOSIS — Z8547 Personal history of malignant neoplasm of testis: Secondary | ICD-10-CM | POA: Diagnosis present

## 2020-08-07 DIAGNOSIS — C6291 Malignant neoplasm of right testis, unspecified whether descended or undescended: Secondary | ICD-10-CM

## 2020-08-07 LAB — CBC WITH DIFFERENTIAL (CANCER CENTER ONLY)
Abs Immature Granulocytes: 0.01 10*3/uL (ref 0.00–0.07)
Basophils Absolute: 0 10*3/uL (ref 0.0–0.1)
Basophils Relative: 1 %
Eosinophils Absolute: 0.1 10*3/uL (ref 0.0–0.5)
Eosinophils Relative: 2 %
HCT: 46.8 % (ref 39.0–52.0)
Hemoglobin: 16 g/dL (ref 13.0–17.0)
Immature Granulocytes: 0 %
Lymphocytes Relative: 37 %
Lymphs Abs: 2 10*3/uL (ref 0.7–4.0)
MCH: 29.9 pg (ref 26.0–34.0)
MCHC: 34.2 g/dL (ref 30.0–36.0)
MCV: 87.5 fL (ref 80.0–100.0)
Monocytes Absolute: 0.6 10*3/uL (ref 0.1–1.0)
Monocytes Relative: 11 %
Neutro Abs: 2.6 10*3/uL (ref 1.7–7.7)
Neutrophils Relative %: 49 %
Platelet Count: 221 10*3/uL (ref 150–400)
RBC: 5.35 MIL/uL (ref 4.22–5.81)
RDW: 12.5 % (ref 11.5–15.5)
WBC Count: 5.4 10*3/uL (ref 4.0–10.5)
nRBC: 0 % (ref 0.0–0.2)

## 2020-08-07 LAB — CMP (CANCER CENTER ONLY)
ALT: 18 U/L (ref 0–44)
AST: 22 U/L (ref 15–41)
Albumin: 4.5 g/dL (ref 3.5–5.0)
Alkaline Phosphatase: 79 U/L (ref 38–126)
Anion gap: 8 (ref 5–15)
BUN: 13 mg/dL (ref 6–20)
CO2: 25 mmol/L (ref 22–32)
Calcium: 9.6 mg/dL (ref 8.9–10.3)
Chloride: 107 mmol/L (ref 98–111)
Creatinine: 1.12 mg/dL (ref 0.61–1.24)
GFR, Estimated: >60 mL/min (ref >60)
Glucose, Bld: 97 mg/dL (ref 70–99)
Potassium: 4.5 mmol/L (ref 3.5–5.1)
Sodium: 140 mmol/L (ref 135–145)
Total Bilirubin: 1.5 mg/dL — ABNORMAL HIGH (ref 0.3–1.2)
Total Protein: 7.4 g/dL (ref 6.5–8.1)

## 2020-08-07 LAB — LACTATE DEHYDROGENASE: LDH: 124 U/L (ref 98–192)

## 2020-08-08 LAB — AFP TUMOR MARKER: AFP, Serum, Tumor Marker: 2.6 ng/mL (ref 0.0–8.3)

## 2020-08-08 LAB — BETA HCG QUANT (REF LAB): hCG Quant: <1 m[IU]/mL (ref 0–3)

## 2020-08-14 ENCOUNTER — Inpatient Hospital Stay: Payer: BLUE CROSS/BLUE SHIELD | Admitting: Oncology

## 2020-08-28 ENCOUNTER — Inpatient Hospital Stay: Payer: BLUE CROSS/BLUE SHIELD | Attending: Oncology | Admitting: Oncology

## 2020-08-28 ENCOUNTER — Other Ambulatory Visit: Payer: Self-pay

## 2020-08-28 VITALS — BP 140/98 | HR 72 | Temp 99.1°F | Resp 19 | Ht 72.0 in | Wt 189.8 lb

## 2020-08-28 DIAGNOSIS — C6291 Malignant neoplasm of right testis, unspecified whether descended or undescended: Secondary | ICD-10-CM | POA: Diagnosis not present

## 2020-08-28 DIAGNOSIS — Z8547 Personal history of malignant neoplasm of testis: Secondary | ICD-10-CM | POA: Diagnosis present

## 2020-08-28 DIAGNOSIS — Z9079 Acquired absence of other genital organ(s): Secondary | ICD-10-CM | POA: Diagnosis not present

## 2020-08-28 NOTE — Progress Notes (Signed)
Hematology and Oncology Follow Up Visit  [name] [id number] 09/08/1986 34 y.o. 08/28/2020 4:00 PM [name], MDKelly, Blake Bushy, MD   Principle Diagnosis: 34 year old man with seminoma of the right testicle presented with stage IA disease in August 2019.     Prior Therapy: He is status post orchiectomy on 05/09/2018. 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  The final pathological staging was pT1a.  No lymphovascular invasion noted at the time.  Current therapy: Active surveillance.  Interim History: [name] returns today for repeat evaluation.  Since the last visit, he reports no major changes in his health.  He continues to enjoy excellent quality of life and performance status.  He denies any recent hospitalization or illnesses.  He denies any bone pain, abdominal pain or lymphadenopathy.        Medications: Updated on review. Current Outpatient Medications  Medication Sig Dispense Refill  . Ascorbic Acid (VITAMIN C) 1000 MG tablet Take 1,000 mg by mouth daily.    Marland Kitchen VITAMIN D, ERGOCALCIFEROL, PO Take 500 mg by mouth daily.     No current facility-administered medications for this visit.     Allergies: No Known Allergies      Physical Exam: Blood pressure (!) 140/98, pulse 72, temperature 99.1 F (37.3 C), temperature source Tympanic, resp. rate 19, height 6' (1.829 m), weight 189 lb 12.8 oz (86.1 kg), SpO2 98 %.   ECOG: 0    General appearance: Comfortable appearing without any discomfort Head: Normocephalic without any trauma Oropharynx: Mucous membranes are moist and pink without any thrush or ulcers. Eyes: Pupils are equal and round reactive to light. Lymph nodes: No cervical, supraclavicular, inguinal or axillary lymphadenopathy.   Heart:regular rate and rhythm.  S1 and S2 without leg edema. Lung: Clear without any rhonchi or wheezes.  No dullness to  percussion. Abdomin: Soft, nontender, nondistended with good bowel sounds.  No hepatosplenomegaly. Musculoskeletal: No joint deformity or effusion.  Full range of motion noted. Neurological: No deficits noted on motor, sensory and deep tendon reflex exam. Skin: No petechial rash or dryness.  Appeared moist.       Lab Results: Lab Results  Component Value Date   WBC 5.4 08/07/2020   HGB 16.0 08/07/2020   HCT 46.8 08/07/2020   MCV 87.5 08/07/2020   PLT 221 08/07/2020     Chemistry      Component Value Date/Time   NA 140 08/07/2020 0738   K 4.5 08/07/2020 0738   CL 107 08/07/2020 0738   CO2 25 08/07/2020 0738   BUN 13 08/07/2020 0738   CREATININE 1.12 08/07/2020 0738      Component Value Date/Time   CALCIUM 9.6 08/07/2020 0738   ALKPHOS 79 08/07/2020 0738   AST 22 08/07/2020 0738   ALT 18 08/07/2020 0738   BILITOT 1.5 (H) 08/07/2020 0738        Impression and Plan:  34 year old man with:  1.    Right testicular seminoma diagnosed in August 2019.  He was found to have stage IA disease.  He is currently on active surveillance after orchiectomy without any evidence of relapsed disease.  Laboratory data obtained on August 07, 2020 were personally reviewed and showed no abnormalities in his CBC, chemistry or tumor markers.     The natural course of this disease and risk of relapse was assessed and treatment options were reviewed.  At this time I recommended continued active surveillance  with annual imaging studies to complete 5 years.  We will continue to mild follow-up with laboratory testing every 6 months.  Salvage therapy includes radiation and systemic chemotherapy will be deferred if he developed stage II or III disease.    2.  Follow-up: In 6 months for repeat evaluation after imaging studies.  30 minutes were dedicated to this visit.  The time was spent on discussing the natural course of his disease, salvage treatment options and future plan of care  review.    Zola Button, MD 12/3/20214:00 PM

## 2021-02-26 ENCOUNTER — Other Ambulatory Visit: Payer: Self-pay

## 2021-02-26 ENCOUNTER — Inpatient Hospital Stay: Payer: BC Managed Care – PPO | Attending: Oncology

## 2021-02-26 DIAGNOSIS — C6291 Malignant neoplasm of right testis, unspecified whether descended or undescended: Secondary | ICD-10-CM

## 2021-02-26 DIAGNOSIS — Z8547 Personal history of malignant neoplasm of testis: Secondary | ICD-10-CM | POA: Insufficient documentation

## 2021-02-26 DIAGNOSIS — Z9079 Acquired absence of other genital organ(s): Secondary | ICD-10-CM | POA: Insufficient documentation

## 2021-02-26 LAB — CBC WITH DIFFERENTIAL (CANCER CENTER ONLY)
Abs Immature Granulocytes: 0.01 10*3/uL (ref 0.00–0.07)
Basophils Absolute: 0.1 10*3/uL (ref 0.0–0.1)
Basophils Relative: 1 %
Eosinophils Absolute: 0.1 10*3/uL (ref 0.0–0.5)
Eosinophils Relative: 2 %
HCT: 46.4 % (ref 39.0–52.0)
Hemoglobin: 15.8 g/dL (ref 13.0–17.0)
Immature Granulocytes: 0 %
Lymphocytes Relative: 35 %
Lymphs Abs: 1.7 10*3/uL (ref 0.7–4.0)
MCH: 30.1 pg (ref 26.0–34.0)
MCHC: 34.1 g/dL (ref 30.0–36.0)
MCV: 88.4 fL (ref 80.0–100.0)
Monocytes Absolute: 0.4 10*3/uL (ref 0.1–1.0)
Monocytes Relative: 9 %
Neutro Abs: 2.5 10*3/uL (ref 1.7–7.7)
Neutrophils Relative %: 53 %
Platelet Count: 233 10*3/uL (ref 150–400)
RBC: 5.25 MIL/uL (ref 4.22–5.81)
RDW: 12.8 % (ref 11.5–15.5)
WBC Count: 4.7 10*3/uL (ref 4.0–10.5)
nRBC: 0 % (ref 0.0–0.2)

## 2021-02-26 LAB — CMP (CANCER CENTER ONLY)
ALT: 17 U/L (ref 0–44)
AST: 17 U/L (ref 15–41)
Albumin: 4.5 g/dL (ref 3.5–5.0)
Alkaline Phosphatase: 78 U/L (ref 38–126)
Anion gap: 8 (ref 5–15)
BUN: 7 mg/dL (ref 6–20)
CO2: 27 mmol/L (ref 22–32)
Calcium: 9.5 mg/dL (ref 8.9–10.3)
Chloride: 105 mmol/L (ref 98–111)
Creatinine: 0.99 mg/dL (ref 0.61–1.24)
GFR, Estimated: >60 mL/min (ref >60)
Glucose, Bld: 98 mg/dL (ref 70–99)
Potassium: 4.2 mmol/L (ref 3.5–5.1)
Sodium: 140 mmol/L (ref 135–145)
Total Bilirubin: 1.1 mg/dL (ref 0.3–1.2)
Total Protein: 7.1 g/dL (ref 6.5–8.1)

## 2021-02-26 LAB — LACTATE DEHYDROGENASE: LDH: 132 U/L (ref 98–192)

## 2021-02-27 LAB — BETA HCG QUANT (REF LAB): hCG Quant: <1 m[IU]/mL (ref 0–3)

## 2021-02-27 LAB — AFP TUMOR MARKER: AFP, Serum, Tumor Marker: 2.4 ng/mL (ref 0.0–6.9)

## 2021-03-01 ENCOUNTER — Other Ambulatory Visit: Payer: Self-pay

## 2021-03-01 ENCOUNTER — Ambulatory Visit (HOSPITAL_COMMUNITY)
Admission: RE | Admit: 2021-03-01 | Discharge: 2021-03-01 | Disposition: A | Discharge status: Home / Self Care | Payer: BC Managed Care – PPO | Source: Ambulatory Visit | Attending: Oncology | Admitting: Oncology

## 2021-03-01 DIAGNOSIS — C6291 Malignant neoplasm of right testis, unspecified whether descended or undescended: Secondary | ICD-10-CM | POA: Diagnosis not present

## 2021-03-01 MED ORDER — IOHEXOL 9 MG/ML PO SOLN
500.0000 mL | ORAL | Status: AC
  Administered 2021-03-01: 1000 mL via ORAL

## 2021-03-01 MED ORDER — IOHEXOL 300 MG/ML  SOLN
100.0000 mL | Freq: Once | INTRAMUSCULAR | Status: AC | PRN
  Administered 2021-03-01: 100 mL via INTRAVENOUS

## 2021-03-01 MED ORDER — SODIUM CHLORIDE (PF) 0.9 % IJ SOLN
INTRAMUSCULAR | Status: AC
  Filled 2021-03-01: qty 50

## 2021-03-04 ENCOUNTER — Other Ambulatory Visit: Payer: Self-pay

## 2021-03-04 ENCOUNTER — Inpatient Hospital Stay: Payer: BC Managed Care – PPO | Admitting: Oncology

## 2021-03-04 VITALS — BP 127/77 | HR 64 | Temp 97.5°F | Resp 18 | Ht 72.0 in | Wt 191.4 lb

## 2021-03-04 DIAGNOSIS — C6291 Malignant neoplasm of right testis, unspecified whether descended or undescended: Secondary | ICD-10-CM

## 2021-03-04 DIAGNOSIS — Z8547 Personal history of malignant neoplasm of testis: Secondary | ICD-10-CM | POA: Diagnosis not present

## 2021-03-04 NOTE — Progress Notes (Signed)
Hematology and Oncology Follow Up Visit  Zuriel Roskos [id number] 1986-01-22 35 y.o. 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     Prior Therapy: He is status post orchiectomy on 05/09/2018. 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  The final pathological staging was pT1a.  No lymphovascular invasion noted at the time.  Current therapy: Active surveillance.  Interim History: Mr. Radke is here for a follow-up visit.  Since the last visit, he reports no recent complaints or issues.  He denies any recent hospitalization or illnesses.  He denies any abdominal pain, weight loss or appetite changes.  His performance status quality of life remained excellent.  He denies any testicular pain or discomfort.  He denies any discharge or lymphadenopathy.        Medications: Unchanged on review. Current Outpatient Medications  Medication Sig Dispense Refill   Ascorbic Acid (VITAMIN C) 1000 MG tablet Take 1,000 mg by mouth daily.     VITAMIN D, ERGOCALCIFEROL, PO Take 500 mg by mouth daily.     No current facility-administered medications for this visit.     Allergies: No Known Allergies      Physical Exam:  Blood pressure 127/77, pulse 64, temperature (!) 97.5 F (36.4 C), temperature source Tympanic, resp. rate 18, height 6' (1.829 m), weight 191 lb 6.4 oz (86.8 kg), SpO2 98 %.   ECOG: 0    General appearance: Alert, awake without any distress. Head: Atraumatic without abnormalities Oropharynx: Without any thrush or ulcers. Eyes: No scleral icterus. Lymph nodes: No lymphadenopathy noted in the cervical, supraclavicular, or axillary nodes Heart:regular rate and rhythm, without any murmurs or gallops.   Lung: Clear to auscultation without any rhonchi, wheezes or dullness to  percussion. Abdomin: Soft, nontender without any shifting dullness or ascites. Musculoskeletal: No clubbing or cyanosis. Neurological: No motor or sensory deficits. Skin: No rashes or lesions.      Lab Results: Lab Results  Component Value Date   WBC 4.7 02/26/2021   HGB 15.8 02/26/2021   HCT 46.4 02/26/2021   MCV 88.4 02/26/2021   PLT 233 02/26/2021     Chemistry      Component Value Date/Time   NA 140 02/26/2021 1011   K 4.2 02/26/2021 1011   CL 105 02/26/2021 1011   CO2 27 02/26/2021 1011   BUN 7 02/26/2021 1011   CREATININE 0.99 02/26/2021 1011      Component Value Date/Time   CALCIUM 9.5 02/26/2021 1011   ALKPHOS 78 02/26/2021 1011   AST 17 02/26/2021 1011   ALT 17 02/26/2021 1011   BILITOT 1.1 02/26/2021 1011      IMPRESSION: 1. No evidence of metastatic disease in the chest, abdomen or pelvis. 2. Post RIGHT orchiectomy. 3. Fat containing LEFT inguinal hernia.  Impression and Plan:  35 year old man with:   1.   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     His disease status was updated at this time including reviewing his laboratory data as well as imaging studies completed on March 01, 2021.  His evaluation has showed no evidence of recurrent disease at this time.  The natural course of this disease and the pattern of relapse associated with early stage seminoma were reviewed.  At this time, he still has risk of relapse and annual CT  scans still recommended to complete 5 years.  Salvage therapy with systemic chemotherapy or radiation would be reserved if he has relapsed disease.      2.  Follow-up: He will return in 6 months for repeat evaluation and in 12 months for repeat scan.   30 minutes were spent on this encounter.  The time was dedicated to reviewing imaging studies, discussing disease status, reviewing future treatment options in case of relapse.     Zola Button, MD 6/9/20223:01 PM

## 2021-03-05 ENCOUNTER — Ambulatory Visit: Payer: BLUE CROSS/BLUE SHIELD | Admitting: Oncology

## 2021-09-02 ENCOUNTER — Inpatient Hospital Stay: Payer: BC Managed Care – PPO | Attending: Oncology

## 2021-09-02 ENCOUNTER — Inpatient Hospital Stay: Payer: BC Managed Care – PPO | Admitting: Oncology

## 2021-09-02 ENCOUNTER — Other Ambulatory Visit: Payer: Self-pay

## 2021-09-02 VITALS — BP 120/84 | HR 62 | Temp 98.4°F | Resp 19 | Ht 72.0 in | Wt 195.5 lb

## 2021-09-02 DIAGNOSIS — Z9079 Acquired absence of other genital organ(s): Secondary | ICD-10-CM | POA: Insufficient documentation

## 2021-09-02 DIAGNOSIS — Z923 Personal history of irradiation: Secondary | ICD-10-CM | POA: Insufficient documentation

## 2021-09-02 DIAGNOSIS — C6291 Malignant neoplasm of right testis, unspecified whether descended or undescended: Secondary | ICD-10-CM

## 2021-09-02 DIAGNOSIS — Z8547 Personal history of malignant neoplasm of testis: Secondary | ICD-10-CM | POA: Diagnosis present

## 2021-09-02 LAB — CBC WITH DIFFERENTIAL (CANCER CENTER ONLY)
Abs Immature Granulocytes: 0.01 10*3/uL (ref 0.00–0.07)
Basophils Absolute: 0 10*3/uL (ref 0.0–0.1)
Basophils Relative: 1 %
Eosinophils Absolute: 0.1 10*3/uL (ref 0.0–0.5)
Eosinophils Relative: 2 %
HCT: 47.6 % (ref 39.0–52.0)
Hemoglobin: 16.2 g/dL (ref 13.0–17.0)
Immature Granulocytes: 0 %
Lymphocytes Relative: 42 %
Lymphs Abs: 2.1 10*3/uL (ref 0.7–4.0)
MCH: 29.6 pg (ref 26.0–34.0)
MCHC: 34 g/dL (ref 30.0–36.0)
MCV: 86.9 fL (ref 80.0–100.0)
Monocytes Absolute: 0.4 10*3/uL (ref 0.1–1.0)
Monocytes Relative: 8 %
Neutro Abs: 2.4 10*3/uL (ref 1.7–7.7)
Neutrophils Relative %: 47 %
Platelet Count: 242 10*3/uL (ref 150–400)
RBC: 5.48 MIL/uL (ref 4.22–5.81)
RDW: 12.7 % (ref 11.5–15.5)
WBC Count: 4.9 10*3/uL (ref 4.0–10.5)
nRBC: 0 % (ref 0.0–0.2)

## 2021-09-02 LAB — CMP (CANCER CENTER ONLY)
ALT: 18 U/L (ref 0–44)
AST: 17 U/L (ref 15–41)
Albumin: 4.5 g/dL (ref 3.5–5.0)
Alkaline Phosphatase: 84 U/L (ref 38–126)
Anion gap: 10 (ref 5–15)
BUN: 14 mg/dL (ref 6–20)
CO2: 22 mmol/L (ref 22–32)
Calcium: 9 mg/dL (ref 8.9–10.3)
Chloride: 109 mmol/L (ref 98–111)
Creatinine: 1.03 mg/dL (ref 0.61–1.24)
GFR, Estimated: >60 mL/min (ref >60)
Glucose, Bld: 103 mg/dL — ABNORMAL HIGH (ref 70–99)
Potassium: 4.2 mmol/L (ref 3.5–5.1)
Sodium: 141 mmol/L (ref 135–145)
Total Bilirubin: 1.1 mg/dL (ref 0.3–1.2)
Total Protein: 7.1 g/dL (ref 6.5–8.1)

## 2021-09-02 LAB — LACTATE DEHYDROGENASE: LDH: 121 U/L (ref 98–192)

## 2021-09-02 NOTE — Progress Notes (Signed)
Hematology and Oncology Follow Up Visit  Camillo Quadros [id number] Jan 28, 1986 35 y.o. 09/02/2021 8:53 AM Addison, Bonney Roussel, FNPAddison, Bonney Roussel, FNP   Principle Diagnosis: 35 year old man with right testicular cancer diagnosed in 2019.  He was found to have stage IA seminoma o  Prior Therapy: He is status post orchiectomy on 05/09/2018. 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  The final pathological staging was pT1a.  No lymphovascular invasion noted at the time.  Current therapy: Active surveillance.  Interim History: Mr. Dagostino returns today for repeat evaluation.  Since last visit, he reports no major changes in his health.  He continues to be active and attends activities of daily living.  He denies abdominal pain or discomfort.  He denies any hospitalizations or illnesses.  He denies any pelvic pain or changes in his bowel habits.       Medications: Reviewed without changes. Current Outpatient Medications  Medication Sig Dispense Refill   Ascorbic Acid (VITAMIN C) 1000 MG tablet Take 1,000 mg by mouth daily.     VITAMIN D, ERGOCALCIFEROL, PO Take 500 mg by mouth daily.     No current facility-administered medications for this visit.     Allergies: No Known Allergies      Physical Exam:  Blood pressure 120/84, pulse 62, temperature 98.4 F (36.9 C), temperature source Tympanic, resp. rate 19, height 6' (1.829 m), weight 195 lb 8 oz (88.7 kg), SpO2 98 %.    ECOG: 0   General appearance: Comfortable appearing without any discomfort Head: Normocephalic without any trauma Oropharynx: Mucous membranes are moist and pink without any thrush or ulcers. Eyes: Pupils are equal and round reactive to light. Lymph nodes: No cervical, supraclavicular, inguinal or axillary lymphadenopathy.   Heart:regular rate and rhythm.  S1 and S2 without leg edema. Lung: Clear without any rhonchi or wheezes.  No  dullness to percussion. Abdomin: Soft, nontender, nondistended with good bowel sounds.  No hepatosplenomegaly. Musculoskeletal: No joint deformity or effusion.  Full range of motion noted. Neurological: No deficits noted on motor, sensory and deep tendon reflex exam. Skin: No petechial rash or dryness.  Appeared moist.        Lab Results: Lab Results  Component Value Date   WBC 4.7 02/26/2021   HGB 15.8 02/26/2021   HCT 46.4 02/26/2021   MCV 88.4 02/26/2021   PLT 233 02/26/2021     Chemistry      Component Value Date/Time   NA 140 02/26/2021 1011   K 4.2 02/26/2021 1011   CL 105 02/26/2021 1011   CO2 27 02/26/2021 1011   BUN 7 02/26/2021 1011   CREATININE 0.99 02/26/2021 1011      Component Value Date/Time   CALCIUM 9.5 02/26/2021 1011   ALKPHOS 78 02/26/2021 1011   AST 17 02/26/2021 1011   ALT 17 02/26/2021 1011   BILITOT 1.1 02/26/2021 1011        Impression and Plan:  35 year old man with:   1.   Right testicular seminoma diagnosed in August 2019.  He was found to have stage IA disease.   The natural course of this disease was reviewed at this time and treatment choices were reiterated.  I recommended continued active surveillance and will repeat CT scan in June 2022.  Salvage therapy with radiation or systemic chemotherapy will be deferred unless he has relapsed disease.  The pattern of relapse associated with seminoma was discussed today with the  patient as well as the need for surveillance at this time.      2.  Follow-up: In 6 months for repeat follow-up and imaging studies.   20 minutes were dedicated to this visit.  The time spent on reviewing the natural course of his disease, treatment choices for the future and outlining future plan of care.     Zola Button, MD 12/8/20228:53 AM

## 2021-09-03 LAB — BETA HCG QUANT (REF LAB): hCG Quant: <1 m[IU]/mL (ref 0–3)

## 2021-09-03 LAB — AFP TUMOR MARKER: AFP, Serum, Tumor Marker: 2.9 ng/mL (ref 0.0–6.9)

## 2022-03-03 ENCOUNTER — Inpatient Hospital Stay: Payer: BC Managed Care – PPO

## 2022-03-04 ENCOUNTER — Other Ambulatory Visit: Payer: Self-pay

## 2022-03-04 ENCOUNTER — Inpatient Hospital Stay: Payer: BC Managed Care – PPO | Attending: Oncology

## 2022-03-04 ENCOUNTER — Ambulatory Visit (HOSPITAL_COMMUNITY)
Admission: RE | Admit: 2022-03-04 | Discharge: 2022-03-04 | Disposition: A | Discharge status: Home / Self Care | Payer: BC Managed Care – PPO | Source: Ambulatory Visit | Attending: Oncology | Admitting: Oncology

## 2022-03-04 DIAGNOSIS — C6291 Malignant neoplasm of right testis, unspecified whether descended or undescended: Secondary | ICD-10-CM

## 2022-03-04 DIAGNOSIS — Z9079 Acquired absence of other genital organ(s): Secondary | ICD-10-CM | POA: Insufficient documentation

## 2022-03-04 LAB — CBC WITH DIFFERENTIAL (CANCER CENTER ONLY)
Abs Immature Granulocytes: 0.01 10*3/uL (ref 0.00–0.07)
Basophils Absolute: 0 10*3/uL (ref 0.0–0.1)
Basophils Relative: 1 %
Eosinophils Absolute: 0.1 10*3/uL (ref 0.0–0.5)
Eosinophils Relative: 3 %
HCT: 47.5 % (ref 39.0–52.0)
Hemoglobin: 16.4 g/dL (ref 13.0–17.0)
Immature Granulocytes: 0 %
Lymphocytes Relative: 36 %
Lymphs Abs: 2 10*3/uL (ref 0.7–4.0)
MCH: 30.4 pg (ref 26.0–34.0)
MCHC: 34.5 g/dL (ref 30.0–36.0)
MCV: 88 fL (ref 80.0–100.0)
Monocytes Absolute: 0.5 10*3/uL (ref 0.1–1.0)
Monocytes Relative: 9 %
Neutro Abs: 2.9 10*3/uL (ref 1.7–7.7)
Neutrophils Relative %: 51 %
Platelet Count: 221 10*3/uL (ref 150–400)
RBC: 5.4 MIL/uL (ref 4.22–5.81)
RDW: 13.2 % (ref 11.5–15.5)
WBC Count: 5.5 10*3/uL (ref 4.0–10.5)
nRBC: 0 % (ref 0.0–0.2)

## 2022-03-04 LAB — CMP (CANCER CENTER ONLY)
ALT: 19 U/L (ref 0–44)
AST: 17 U/L (ref 15–41)
Albumin: 4.8 g/dL (ref 3.5–5.0)
Alkaline Phosphatase: 65 U/L (ref 38–126)
Anion gap: 5 (ref 5–15)
BUN: 16 mg/dL (ref 6–20)
CO2: 29 mmol/L (ref 22–32)
Calcium: 9.8 mg/dL (ref 8.9–10.3)
Chloride: 106 mmol/L (ref 98–111)
Creatinine: 1.07 mg/dL (ref 0.61–1.24)
GFR, Estimated: >60 mL/min (ref >60)
Glucose, Bld: 72 mg/dL (ref 70–99)
Potassium: 4.1 mmol/L (ref 3.5–5.1)
Sodium: 140 mmol/L (ref 135–145)
Total Bilirubin: 0.9 mg/dL (ref 0.3–1.2)
Total Protein: 7.2 g/dL (ref 6.5–8.1)

## 2022-03-04 LAB — LACTATE DEHYDROGENASE: LDH: 102 U/L (ref 98–192)

## 2022-03-04 MED ORDER — SODIUM CHLORIDE (PF) 0.9 % IJ SOLN
INTRAMUSCULAR | Status: AC
  Filled 2022-03-04: qty 50

## 2022-03-04 MED ORDER — IOHEXOL 300 MG/ML  SOLN
100.0000 mL | Freq: Once | INTRAMUSCULAR | Status: AC | PRN
  Administered 2022-03-04: 100 mL via INTRAVENOUS

## 2022-03-05 LAB — BETA HCG QUANT (REF LAB): hCG Quant: <1 m[IU]/mL (ref 0–3)

## 2022-03-05 LAB — AFP TUMOR MARKER: AFP, Serum, Tumor Marker: 2.9 ng/mL (ref 0.0–6.9)

## 2022-03-10 ENCOUNTER — Inpatient Hospital Stay: Payer: BC Managed Care – PPO | Admitting: Oncology

## 2022-03-10 ENCOUNTER — Other Ambulatory Visit: Payer: Self-pay

## 2022-03-10 VITALS — BP 120/91 | HR 58 | Temp 97.9°F | Resp 20 | Wt 193.7 lb

## 2022-03-10 DIAGNOSIS — C6291 Malignant neoplasm of right testis, unspecified whether descended or undescended: Secondary | ICD-10-CM | POA: Diagnosis present

## 2022-03-10 DIAGNOSIS — Z9079 Acquired absence of other genital organ(s): Secondary | ICD-10-CM | POA: Diagnosis not present

## 2022-03-10 NOTE — Progress Notes (Signed)
Hematology and Oncology Follow Up Visit  [name] [id number] November 07, 1985 36 y.o. 03/10/2022 9:29 AM Addison, Bonney Roussel, FNPAddison, Bonney Roussel, FNP   Principle Diagnosis: 36 year old man with stage IA right testicular seminoma diagnosed in 2019.    Prior Therapy: He is status post orchiectomy on 05/09/2018. 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  The final pathological staging was pT1a.  No lymphovascular invasion noted at the time.  Current therapy: Active surveillance.  Interim History: Mr. Lebarron presents today for a follow-up visit.  Since the last visit, he reports no major changes in his health.  He continues to be active and attends activities of daily living.  He denies any recent hospitalizations or illnesses.  He denies any abdominal pain or discomfort.       Medications: Updated on review. Current Outpatient Medications  Medication Sig Dispense Refill   Ascorbic Acid (VITAMIN C) 1000 MG tablet Take 1,000 mg by mouth daily.     VITAMIN D, ERGOCALCIFEROL, PO Take 500 mg by mouth daily.     No current facility-administered medications for this visit.     Allergies: No Known Allergies      Physical Exam:   Blood pressure (!) 120/91, pulse (!) 58, temperature 97.9 F (36.6 C), resp. rate 20, weight 193 lb 11.2 oz (87.9 kg), SpO2 98 %.    ECOG: 0    General appearance: Alert, awake without any distress. Head: Atraumatic without abnormalities Oropharynx: Without any thrush or ulcers. Eyes: No scleral icterus. Lymph nodes: No lymphadenopathy noted in the cervical, supraclavicular, or axillary nodes Heart:regular rate and rhythm, without any murmurs or gallops.   Lung: Clear to auscultation without any rhonchi, wheezes or dullness to percussion. Abdomin: Soft, nontender without any shifting dullness or ascites. Musculoskeletal: No clubbing or cyanosis. Neurological: No motor or  sensory deficits. Skin: No rashes or lesions.        Lab Results: Lab Results  Component Value Date   WBC 5.5 03/04/2022   HGB 16.4 03/04/2022   HCT 47.5 03/04/2022   MCV 88.0 03/04/2022   PLT 221 03/04/2022     Chemistry      Component Value Date/Time   NA 140 03/04/2022 0801   K 4.1 03/04/2022 0801   CL 106 03/04/2022 0801   CO2 29 03/04/2022 0801   BUN 16 03/04/2022 0801   CREATININE 1.07 03/04/2022 0801      Component Value Date/Time   CALCIUM 9.8 03/04/2022 0801   ALKPHOS 65 03/04/2022 0801   AST 17 03/04/2022 0801   ALT 19 03/04/2022 0801   BILITOT 0.9 03/04/2022 0801       IMPRESSION: 1. Evidence of metastatic disease in the chest abdomen or pelvis. 2. Status post right orchiectomy. 3. Moderate-sized fat containing left inguinal hernia.     Impression and Plan:  36 year old man with:   1.   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    He is currently on active surveillance without any evidence of relapse.  CT scan obtained on March 04, 2022 was personally reviewed and showed no evidence of relapsed disease.  At this time, I have recommended changing to annual surveillance with a repeat scan in 1 year.  He is scan will be repeated every other year after that.  We will continue treatment while monitoring every 6 months for laboratory testing.    2.  Genetic considerations: His family history was reviewed today  in detail.  His father has testicular cancer and most recently was diagnosed with advanced hepatocellular malignancy.  I do not feel that this has any implication on his health.  His father was a heavy smoker and a drinker which she does not do that.  At this time this will impact our surveillance strategy.    3.  Follow-up: He will return in 6 months for repeat evaluation.   30 minutes were spent on this encounter.  The time was dedicated to reviewing laboratory data, disease status update and outlining future plan of care  discussion.     Zola Button, MD 6/15/20239:29 AM

## 2022-03-24 ENCOUNTER — Encounter: Payer: Self-pay | Admitting: *Deleted

## 2022-03-28 ENCOUNTER — Telehealth: Payer: Self-pay | Admitting: Psychiatry

## 2022-03-28 ENCOUNTER — Ambulatory Visit: Payer: Self-pay | Admitting: Psychiatry

## 2022-03-28 NOTE — Telephone Encounter (Signed)
LVM and sent mychart msg informing pt of r/s needed- Dr. Billey Gosling out.

## 2022-04-05 ENCOUNTER — Ambulatory Visit: Payer: Self-pay | Admitting: Psychiatry

## 2022-09-08 ENCOUNTER — Other Ambulatory Visit: Payer: BC Managed Care – PPO

## 2022-09-15 ENCOUNTER — Ambulatory Visit: Payer: BC Managed Care – PPO | Admitting: Oncology

## 2022-09-16 ENCOUNTER — Inpatient Hospital Stay: Payer: BC Managed Care – PPO | Attending: Oncology

## 2022-09-16 ENCOUNTER — Other Ambulatory Visit: Payer: Self-pay

## 2022-09-16 DIAGNOSIS — C6291 Malignant neoplasm of right testis, unspecified whether descended or undescended: Secondary | ICD-10-CM | POA: Diagnosis present

## 2022-09-16 DIAGNOSIS — Z9079 Acquired absence of other genital organ(s): Secondary | ICD-10-CM | POA: Insufficient documentation

## 2022-09-16 LAB — CMP (CANCER CENTER ONLY)
ALT: 17 U/L (ref 0–44)
AST: 15 U/L (ref 15–41)
Albumin: 4.5 g/dL (ref 3.5–5.0)
Alkaline Phosphatase: 84 U/L (ref 38–126)
Anion gap: 6 (ref 5–15)
BUN: 18 mg/dL (ref 6–20)
CO2: 27 mmol/L (ref 22–32)
Calcium: 9.5 mg/dL (ref 8.9–10.3)
Chloride: 107 mmol/L (ref 98–111)
Creatinine: 1.03 mg/dL (ref 0.61–1.24)
GFR, Estimated: >60 mL/min (ref >60)
Glucose, Bld: 108 mg/dL — ABNORMAL HIGH (ref 70–99)
Potassium: 4.1 mmol/L (ref 3.5–5.1)
Sodium: 140 mmol/L (ref 135–145)
Total Bilirubin: 0.8 mg/dL (ref 0.3–1.2)
Total Protein: 6.9 g/dL (ref 6.5–8.1)

## 2022-09-16 LAB — CBC WITH DIFFERENTIAL (CANCER CENTER ONLY)
Abs Immature Granulocytes: 0.01 10*3/uL (ref 0.00–0.07)
Basophils Absolute: 0 10*3/uL (ref 0.0–0.1)
Basophils Relative: 1 %
Eosinophils Absolute: 0.2 10*3/uL (ref 0.0–0.5)
Eosinophils Relative: 3 %
HCT: 46.4 % (ref 39.0–52.0)
Hemoglobin: 16 g/dL (ref 13.0–17.0)
Immature Granulocytes: 0 %
Lymphocytes Relative: 36 %
Lymphs Abs: 2.3 10*3/uL (ref 0.7–4.0)
MCH: 30 pg (ref 26.0–34.0)
MCHC: 34.5 g/dL (ref 30.0–36.0)
MCV: 86.9 fL (ref 80.0–100.0)
Monocytes Absolute: 0.5 10*3/uL (ref 0.1–1.0)
Monocytes Relative: 8 %
Neutro Abs: 3.3 10*3/uL (ref 1.7–7.7)
Neutrophils Relative %: 52 %
Platelet Count: 183 10*3/uL (ref 150–400)
RBC: 5.34 MIL/uL (ref 4.22–5.81)
RDW: 12.6 % (ref 11.5–15.5)
WBC Count: 6.3 10*3/uL (ref 4.0–10.5)
nRBC: 0 % (ref 0.0–0.2)

## 2022-09-16 LAB — LACTATE DEHYDROGENASE: LDH: 95 U/L — ABNORMAL LOW (ref 98–192)

## 2022-09-17 LAB — BETA HCG QUANT (REF LAB): hCG Quant: <1 m[IU]/mL (ref 0–3)

## 2022-09-17 LAB — AFP TUMOR MARKER: AFP, Serum, Tumor Marker: 3.2 ng/mL (ref 0.0–6.9)

## 2022-09-21 ENCOUNTER — Inpatient Hospital Stay: Payer: BC Managed Care – PPO | Admitting: Oncology

## 2022-09-21 VITALS — BP 118/78 | HR 78 | Temp 97.8°F | Resp 16 | Ht 72.0 in | Wt 193.4 lb

## 2022-09-21 DIAGNOSIS — C6291 Malignant neoplasm of right testis, unspecified whether descended or undescended: Secondary | ICD-10-CM | POA: Diagnosis not present

## 2022-09-21 NOTE — Progress Notes (Signed)
Hematology and Oncology Follow Up Visit  [name] [id number] 01-03-1986 36 y.o. 09/21/2022 11:41 AM Addison, Bonney Roussel, FNPAddison, Bonney Roussel, FNP   Principle Diagnosis: 36 year old man with right testicular cancer diagnosed in 2019.  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  Prior Therapy: He is status post orchiectomy on 05/09/2018. 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  The final pathological staging was pT1a.  No lymphovascular invasion noted at the time.  Current therapy: Active surveillance.  Interim History: [name] returns today for repeat evaluation.  Since the last visit, he reports no major changes in his health.  Continues to be active and does do activities of daily living.  He denies any chest pain, shortness of breath or difficulty breathing.  He denies abdominal pain or early satiety.  He remains up-to-date on his health maintenance.       Medications: Reviewed without changes. Current Outpatient Medications  Medication Sig Dispense Refill   Ascorbic Acid (VITAMIN C) 1000 MG tablet Take 1,000 mg by mouth daily.     VITAMIN D, ERGOCALCIFEROL, PO Take 500 mg by mouth daily.     No current facility-administered medications for this visit.     Allergies: No Known Allergies      Physical Exam:   Blood pressure 118/78, pulse 78, temperature 97.8 F (36.6 C), temperature source Temporal, resp. rate 16, height 6' (1.829 m), weight 193 lb 6.4 oz (87.7 kg), SpO2 100 %.    ECOG: 0   General appearance: Comfortable appearing without any discomfort Head: Normocephalic without any trauma Oropharynx: Mucous membranes are moist and pink without any thrush or ulcers. Eyes: Pupils are equal and round reactive to light. Lymph nodes: No cervical, supraclavicular, inguinal or axillary lymphadenopathy.   Heart:regular rate and rhythm.  S1 and S2  without leg edema. Lung: Clear without any rhonchi or wheezes.  No dullness to percussion. Abdomin: Soft, nontender, nondistended with good bowel sounds.  No hepatosplenomegaly. Musculoskeletal: No joint deformity or effusion.  Full range of motion noted. Neurological: No deficits noted on motor, sensory and deep tendon reflex exam. Skin: No petechial rash or dryness.  Appeared moist.         Lab Results: Lab Results  Component Value Date   WBC 6.3 09/16/2022   HGB 16.0 09/16/2022   HCT 46.4 09/16/2022   MCV 86.9 09/16/2022   PLT 183 09/16/2022     Chemistry      Component Value Date/Time   NA 140 09/16/2022 0752   K 4.1 09/16/2022 0752   CL 107 09/16/2022 0752   CO2 27 09/16/2022 0752   BUN 18 09/16/2022 0752   CREATININE 1.03 09/16/2022 0752      Component Value Date/Time   CALCIUM 9.5 09/16/2022 0752   ALKPHOS 84 09/16/2022 0752   AST 15 09/16/2022 0752   ALT 17 09/16/2022 0752   BILITOT 0.8 09/16/2022 0752            Impression and Plan:  36 year old man with:   1.   Testicular cancer diagnosed in 2019.  He was found to have stage IA right seminoma and currently on active surveillance.  The natural course of this disease was discussed on risk of relapse associated with stage IA seminoma was reviewed.  Although his risk of relapse remains very low late relapses on has been documented with seminoma.  I recommended continued surveillance with  repeat imaging studies to complete 5 years in June 2024.  After that, I recommended annual surveillance with physical examination laboratory testing and repeat imaging studies every other year to complete 10 years.  He is agreeable with this plan and will return in 6 months for his next imaging.  2.  Genetic considerations: His father has been diagnosed with what appears to be hepatocellular carcinoma no other family history of radiation at this time.  3.  Age-appropriate health screening: He is up-to-date at this  time.    3.  Follow-up: In 6 months for repeat follow-up.   30 minutes were dedicated to this visit.  The time was spent on updating disease status, treatment choices and outlining future plan of care review.     Zola Button, MD 12/27/202311:41 AM

## 2022-11-03 ENCOUNTER — Other Ambulatory Visit: Payer: Self-pay | Admitting: Hematology and Oncology

## 2022-11-03 DIAGNOSIS — C6291 Malignant neoplasm of right testis, unspecified whether descended or undescended: Secondary | ICD-10-CM

## 2022-11-25 IMAGING — CT CT CHEST-ABD-PELV W/ CM
2 of 3 series · 15 of 36 positions shown, 17 images · IV contrast (omnipaque)
Comparison: January 23, 2020.

CLINICAL DATA: Testicular cancer in a 34-year-old male. History of
RIGHT orchiectomy.

EXAM:
CT CHEST, ABDOMEN, AND PELVIS WITH CONTRAST
TECHNIQUE: Multidetector CT imaging of the chest, abdomen and pelvis was
performed following the standard protocol during bolus
administration of intravenous contrast.
CONTRAST:  100mL OMNIPAQUE IOHEXOL 300 MG/ML  SOLN

[Series 4: lung · axial · 0.72mm/px · z∈[-380,-88]mm · 12 of 172 slices shown, 14 images]
[im 13/172  mediastinal]
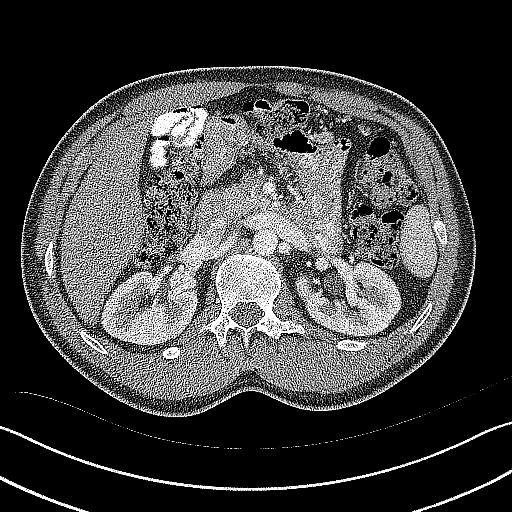
[im 13/172  bone]
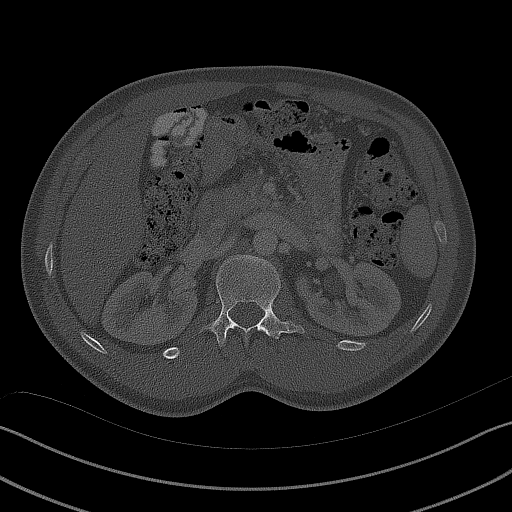
[im 26/172  mediastinal]
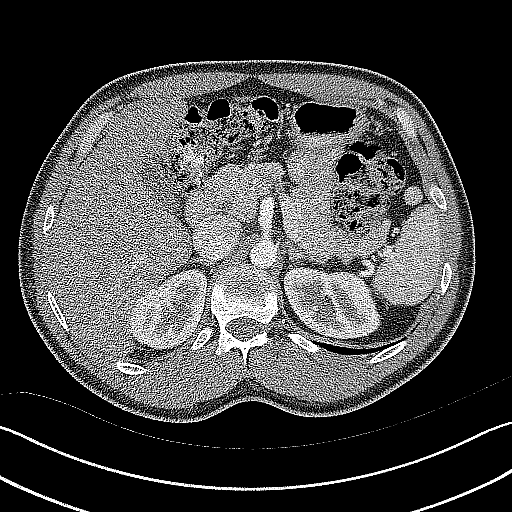
[im 39/172  mediastinal]
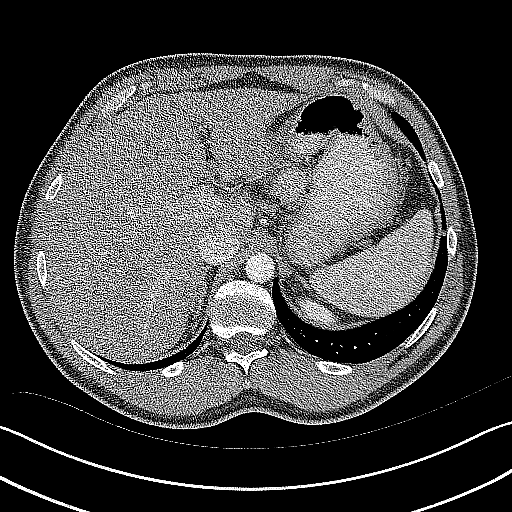
[im 51/172  mediastinal]
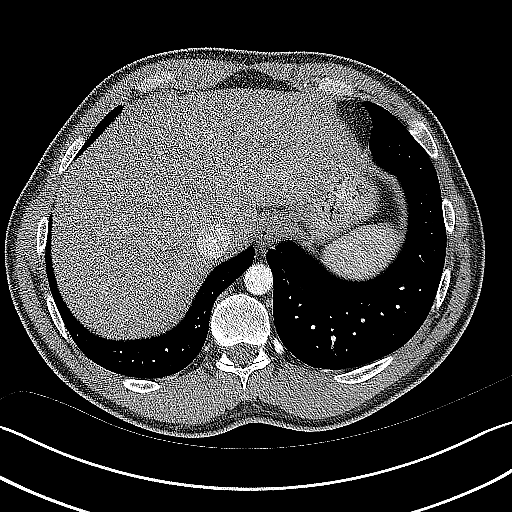
[im 64/172  mediastinal]
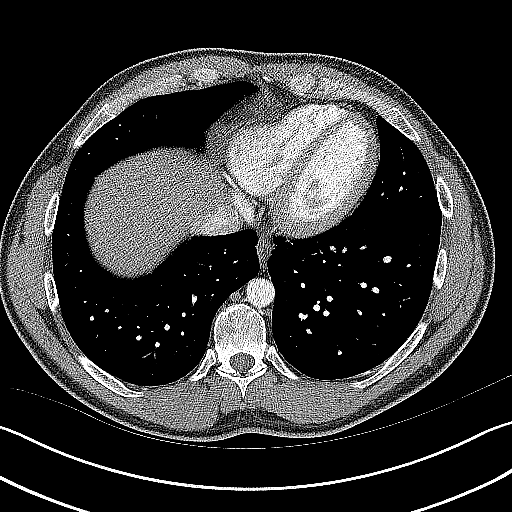
[im 77/172  mediastinal]
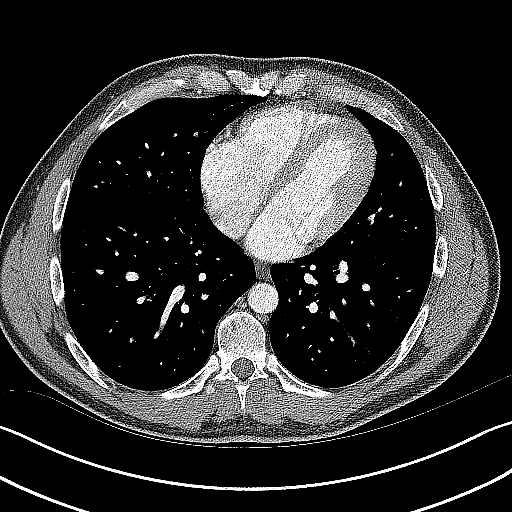
[im 96/172  mediastinal]
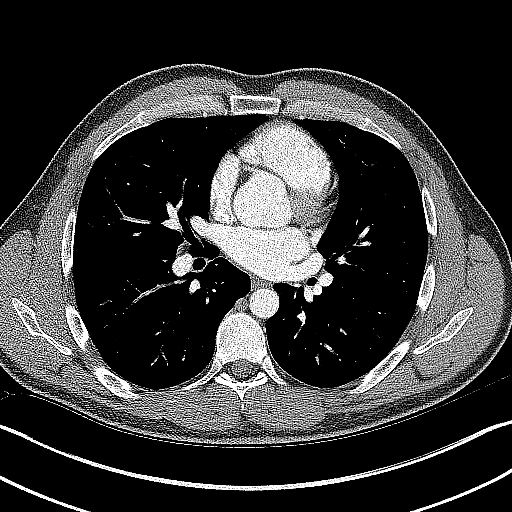
[im 108/172  mediastinal]
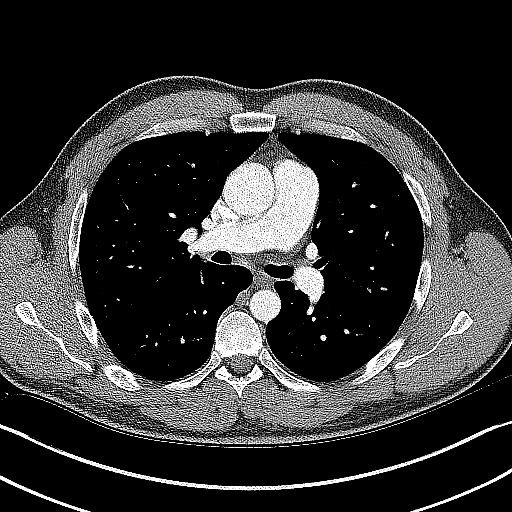
[im 121/172  mediastinal]
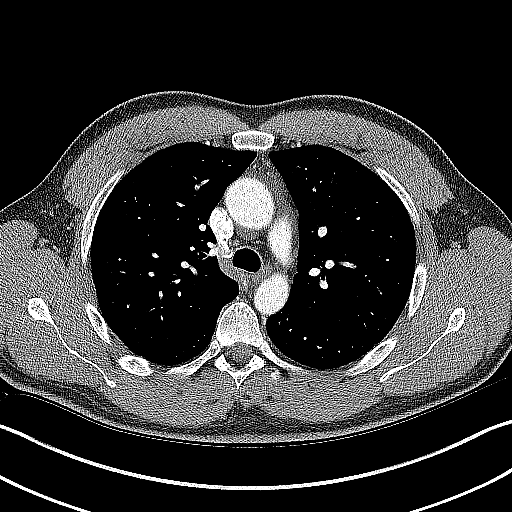
[im 121/172  bone]
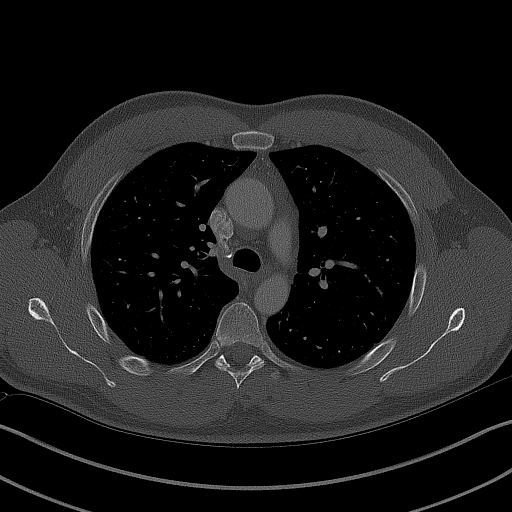
[im 134/172  mediastinal]
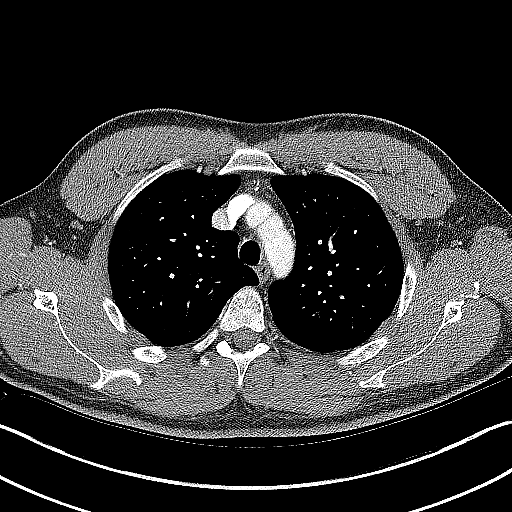
[im 146/172  mediastinal]
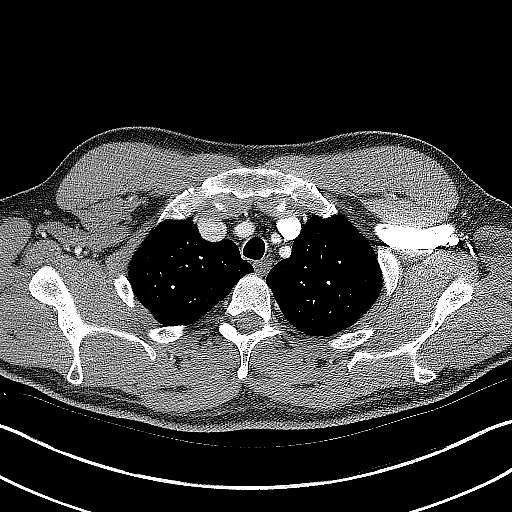
[im 159/172  mediastinal]
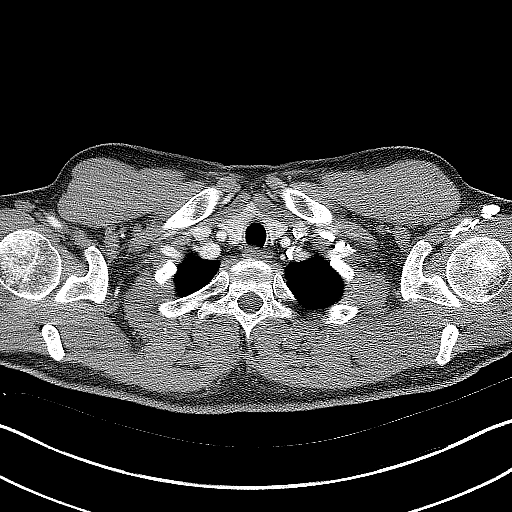

[Series 5: coronals · coronal · 0.84mm/px · 3 of 146 slices shown]
[im 30/146  mediastinal]
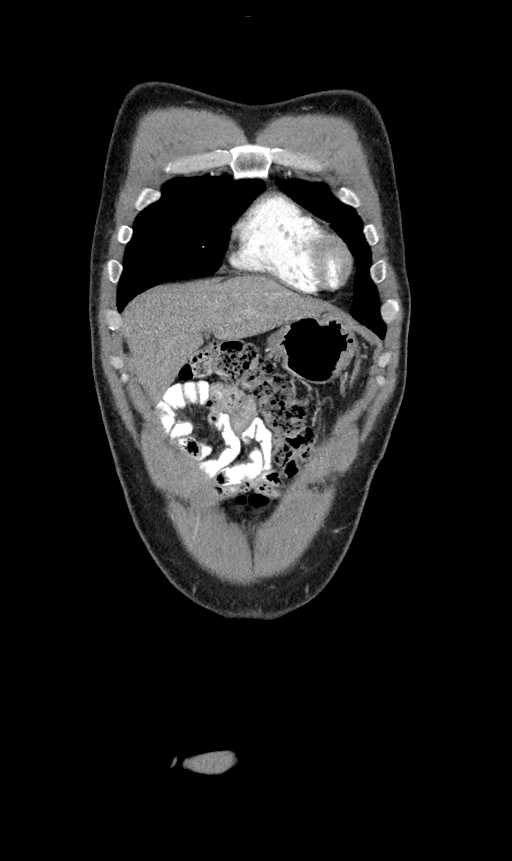
[im 59/146  mediastinal]
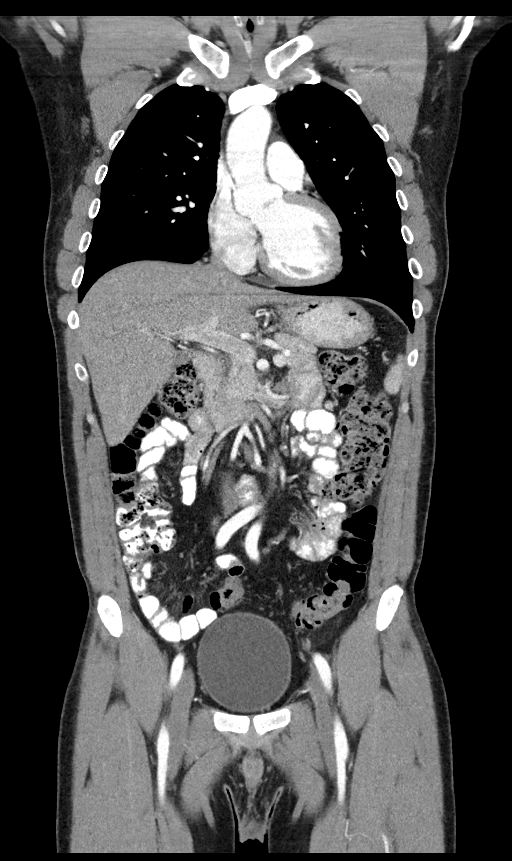
[im 88/146  mediastinal]
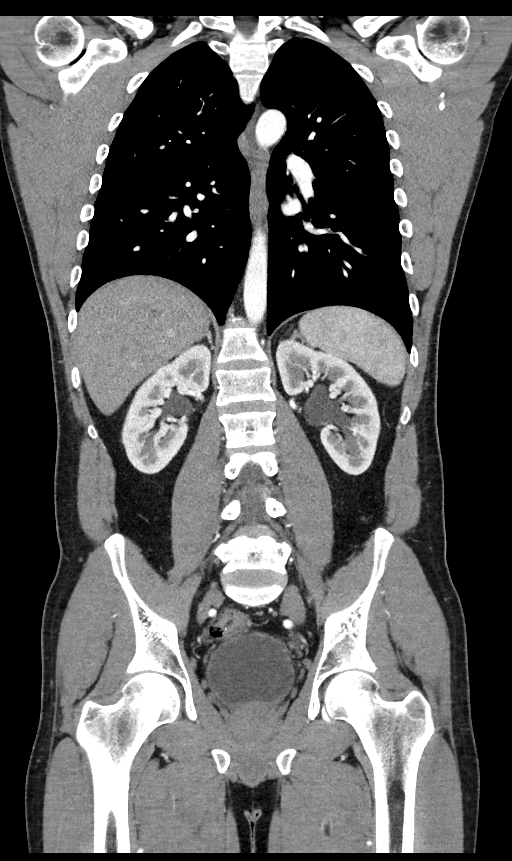

[15 of 36 positions shown; findings below may reference images not displayed]

FINDINGS: CT CHEST FINDINGS

Cardiovascular: Normal appearance of heart and great vessels.

Mediastinum/Nodes: Esophagus grossly normal. No hilar, mediastinal,
thoracic inlet or axillary lymphadenopathy.

Lungs/Pleura: Tiny nodule in the RIGHT middle lobe (image 83/4)
unchanged at 3 mm size. Airways are patent. No effusion or
consolidation.

Musculoskeletal: No acute findings relative to the bony thorax. No
destructive bone process about the bony thorax. Please see below for
full musculoskeletal details.

CT ABDOMEN PELVIS FINDINGS

Hepatobiliary: No focal, suspicious hepatic lesion. Portal vein is
patent. No pericholecystic stranding or signs of biliary duct
distension.

Pancreas: Normal, without mass, inflammation or ductal dilatation.

Spleen: Spleen normal size and contour.

Adrenals/Urinary Tract: Adrenal glands are normal.

Symmetric renal enhancement. Urinary bladder with smooth contours.
No suspicious renal lesion. Stable mild fullness of the LEFT and
RIGHT ureter.

Stomach/Bowel: No acute gastrointestinal process. Appendix is
normal.

Vascular/Lymphatic: Smooth contour of the aorta and IVC. There is no
gastrohepatic or hepatoduodenal ligament lymphadenopathy. No
retroperitoneal or mesenteric lymphadenopathy.

No pelvic sidewall lymphadenopathy.

Reproductive: Post RIGHT orchiectomy. Fat containing LEFT inguinal
hernia.

Other: No ascites.  Moderate fat containing LEFT inguinal hernia.

Musculoskeletal: No acute musculoskeletal process. Spinal
degenerative changes at the L5-S1 level due to pars defects.
IMPRESSION: 1. No evidence of metastatic disease in the chest, abdomen or
pelvis.
2. Post RIGHT orchiectomy.
3. Fat containing LEFT inguinal hernia.

## 2023-02-10 ENCOUNTER — Telehealth: Payer: Self-pay

## 2023-02-10 NOTE — Telephone Encounter (Signed)
Called and given phone # for radiology scheduling. He will call to schedule CT and call the office back. He will be on vacation the week of 6/27. He will schedule CT on 6/21.

## 2023-03-09 ENCOUNTER — Telehealth: Payer: Self-pay

## 2023-03-09 ENCOUNTER — Telehealth: Payer: Self-pay | Admitting: Hematology and Oncology

## 2023-03-09 NOTE — Telephone Encounter (Signed)
Called and left a message asking him to call the office back. Prior to CT on 6/21, he needs to have a lab appt.

## 2023-03-09 NOTE — Telephone Encounter (Signed)
Returning call per voicemail, patient stated they will be out of town for appointment needed to be rescheduled. Called twice, and left a message for patient regarding new appointment time/date.

## 2023-03-10 NOTE — Telephone Encounter (Signed)
Called and scheduled lab appt on 6/20 at 0745 and rescheduled Dr. Bertis Ruddy appt to 7/5 at 9am. He is aware of appts.

## 2023-03-16 ENCOUNTER — Other Ambulatory Visit: Payer: Self-pay

## 2023-03-16 ENCOUNTER — Inpatient Hospital Stay: Payer: 59 | Attending: Hematology and Oncology

## 2023-03-16 DIAGNOSIS — Z8547 Personal history of malignant neoplasm of testis: Secondary | ICD-10-CM | POA: Diagnosis present

## 2023-03-16 DIAGNOSIS — C6291 Malignant neoplasm of right testis, unspecified whether descended or undescended: Secondary | ICD-10-CM

## 2023-03-16 LAB — CBC WITH DIFFERENTIAL/PLATELET
Abs Immature Granulocytes: 0.02 10*3/uL (ref 0.00–0.07)
Basophils Absolute: 0 10*3/uL (ref 0.0–0.1)
Basophils Relative: 1 %
Eosinophils Absolute: 0.1 10*3/uL (ref 0.0–0.5)
Eosinophils Relative: 1 %
HCT: 47.2 % (ref 39.0–52.0)
Hemoglobin: 16 g/dL (ref 13.0–17.0)
Immature Granulocytes: 0 %
Lymphocytes Relative: 36 %
Lymphs Abs: 2.1 10*3/uL (ref 0.7–4.0)
MCH: 29.9 pg (ref 26.0–34.0)
MCHC: 33.9 g/dL (ref 30.0–36.0)
MCV: 88.2 fL (ref 80.0–100.0)
Monocytes Absolute: 0.4 10*3/uL (ref 0.1–1.0)
Monocytes Relative: 7 %
Neutro Abs: 3.1 10*3/uL (ref 1.7–7.7)
Neutrophils Relative %: 55 %
Platelets: 213 10*3/uL (ref 150–400)
RBC: 5.35 MIL/uL (ref 4.22–5.81)
RDW: 13.2 % (ref 11.5–15.5)
WBC: 5.7 10*3/uL (ref 4.0–10.5)
nRBC: 0 % (ref 0.0–0.2)

## 2023-03-16 LAB — COMPREHENSIVE METABOLIC PANEL
ALT: 33 U/L (ref 0–44)
AST: 21 U/L (ref 15–41)
Albumin: 4.3 g/dL (ref 3.5–5.0)
Alkaline Phosphatase: 72 U/L (ref 38–126)
Anion gap: 7 (ref 5–15)
BUN: 18 mg/dL (ref 6–20)
CO2: 26 mmol/L (ref 22–32)
Calcium: 9.3 mg/dL (ref 8.9–10.3)
Chloride: 106 mmol/L (ref 98–111)
Creatinine, Ser: 1.05 mg/dL (ref 0.61–1.24)
GFR, Estimated: >60 mL/min (ref >60)
Glucose, Bld: 143 mg/dL — ABNORMAL HIGH (ref 70–99)
Potassium: 4 mmol/L (ref 3.5–5.1)
Sodium: 139 mmol/L (ref 135–145)
Total Bilirubin: 1 mg/dL (ref 0.3–1.2)
Total Protein: 6.6 g/dL (ref 6.5–8.1)

## 2023-03-16 LAB — LACTATE DEHYDROGENASE: LDH: 103 U/L (ref 98–192)

## 2023-03-17 ENCOUNTER — Ambulatory Visit (HOSPITAL_COMMUNITY)
Admission: RE | Admit: 2023-03-17 | Discharge: 2023-03-17 | Disposition: A | Discharge status: Home / Self Care | Payer: 59 | Source: Ambulatory Visit | Attending: Oncology | Admitting: Oncology

## 2023-03-17 DIAGNOSIS — C6291 Malignant neoplasm of right testis, unspecified whether descended or undescended: Secondary | ICD-10-CM | POA: Diagnosis not present

## 2023-03-17 MED ORDER — IOHEXOL 300 MG/ML  SOLN
100.0000 mL | Freq: Once | INTRAMUSCULAR | Status: AC | PRN
  Administered 2023-03-17: 100 mL via INTRAVENOUS

## 2023-03-17 MED ORDER — SODIUM CHLORIDE (PF) 0.9 % IJ SOLN
INTRAMUSCULAR | Status: AC
  Filled 2023-03-17: qty 50

## 2023-03-18 LAB — BETA HCG QUANT (REF LAB): hCG Quant: <1 m[IU]/mL (ref 0–3)

## 2023-03-19 LAB — AFP TUMOR MARKER: AFP, Serum, Tumor Marker: 3.9 ng/mL (ref 0.0–6.9)

## 2023-03-23 ENCOUNTER — Other Ambulatory Visit: Payer: BC Managed Care – PPO

## 2023-03-28 ENCOUNTER — Ambulatory Visit: Payer: BC Managed Care – PPO | Admitting: Hematology and Oncology

## 2023-03-31 ENCOUNTER — Encounter: Payer: Self-pay | Admitting: Genetic Counselor

## 2023-03-31 ENCOUNTER — Other Ambulatory Visit: Payer: Self-pay

## 2023-03-31 ENCOUNTER — Inpatient Hospital Stay: Payer: 59 | Attending: Hematology and Oncology | Admitting: Hematology and Oncology

## 2023-03-31 ENCOUNTER — Telehealth: Payer: Self-pay

## 2023-03-31 ENCOUNTER — Other Ambulatory Visit: Payer: BC Managed Care – PPO

## 2023-03-31 VITALS — BP 131/96 | HR 64 | Temp 97.7°F | Resp 16 | Wt 202.2 lb

## 2023-03-31 DIAGNOSIS — K4090 Unilateral inguinal hernia, without obstruction or gangrene, not specified as recurrent: Secondary | ICD-10-CM

## 2023-03-31 DIAGNOSIS — Z8547 Personal history of malignant neoplasm of testis: Secondary | ICD-10-CM

## 2023-03-31 DIAGNOSIS — Z809 Family history of malignant neoplasm, unspecified: Secondary | ICD-10-CM | POA: Diagnosis not present

## 2023-03-31 DIAGNOSIS — N433 Hydrocele, unspecified: Secondary | ICD-10-CM

## 2023-03-31 DIAGNOSIS — Z9079 Acquired absence of other genital organ(s): Secondary | ICD-10-CM | POA: Insufficient documentation

## 2023-03-31 NOTE — Telephone Encounter (Signed)
Faxed referral to [facility] to Dr. Berneice Heinrich at [phone], received fax confirmation.

## 2023-03-31 NOTE — Assessment & Plan Note (Signed)
He is quite symptomatic but given his hydrocele, I think that needs to be addressed first I will refer him to general surgery in the future for hernia repair after he has seen urologist

## 2023-03-31 NOTE — Assessment & Plan Note (Signed)
He has normal tumor markers CT imaging was reviewed with the patient which show no evidence of disease The patient is 5 years out from original diagnosis He is a long-term cancer survivor and does not need long-term follow-up

## 2023-03-31 NOTE — Progress Notes (Signed)
[facility] FOLLOW-UP progress notes  Patient Care Team: Addison, Marlowe Alt, FNP as PCP - General (Family Medicine)  CHIEF COMPLAINTS/PURPOSE OF VISIT:  Left testicular cancer status post orchiectomy, seminoma, for further evaluation and management  HISTORY OF PRESENTING ILLNESS:  [name] 37 y.o. male was transferred to my care after his prior physician has left.  I reviewed the patient's records extensive and collaborated the history with the patient. Summary of his history is as follows:  Principle Diagnosis: 37 year old man with right testicular cancer diagnosed in 2019.  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   Prior Therapy: He is status post orchiectomy on 05/09/2018. 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  The final pathological staging was pT1a.  No lymphovascular invasion noted at the time.   Current therapy: Active surveillance  His only complaints is related to his left inguinal hernia that bothers him.  It is associated with hydrocele.  He has not seen urologist for over 3 years.  His father who is currently in terminal care for stage IV liver cancer had history of testicular cancer at the age of 75.  He has 1 brother who is healthy.  MEDICAL HISTORY:  Past Medical History:  Diagnosis Date   H/O multiple concussions    History of shingles    Testicular cancer (HCC)     SURGICAL HISTORY: Past Surgical History:  Procedure Laterality Date   HERNIA REPAIR     KNEE ARTHROSCOPY     ORCHIECTOMY Right 05/09/2018   Procedure: ORCHIECTOMY;  Surgeon: Sebastian Ache, MD;  Location: [facility];  Service: Urology;  Laterality: Right;    SOCIAL HISTORY: Social History   Socioeconomic History   Marital status: Married    Spouse name: Not on file   Number of children: 3   Years of education: Not on file   Highest education level: Not on file   Occupational History   Not on file  Tobacco Use   Smoking status: Never   Smokeless tobacco: Never  Vaping Use   Vaping Use: Never used  Substance and Sexual Activity   Alcohol use: No   Drug use: No   Sexual activity: Yes  Other Topics Concern   Not on file  Social History Narrative   Lives with wife,adopted 3 children   Social Determinants of Health   Financial Resource Strain: Not on file  Food Insecurity: Not on file  Transportation Needs: Not on file  Physical Activity: Not on file  Stress: Not on file  Social Connections: Not on file  Intimate Partner Violence: Not on file    FAMILY HISTORY: Family History  Problem Relation Age of Onset   Glaucoma Mother    Diabetes Father    Testicular cancer Father 42   Heart disease Father    COPD Father    Alcohol abuse Father    Liver cancer Father     ALLERGIES:  has No Known Allergies.  MEDICATIONS:  Current Outpatient Medications  Medication Sig Dispense Refill   Ascorbic Acid (VITAMIN C) 1000 MG tablet Take 1,000 mg by mouth daily.     VITAMIN D, ERGOCALCIFEROL, PO Take 500 mg by mouth daily.     No current facility-administered medications for this visit.    REVIEW OF SYSTEMS:   Constitutional: Denies fevers, chills or abnormal night sweats Eyes: Denies blurriness of vision, double vision or  watery eyes Ears, nose, mouth, throat, and face: Denies mucositis or sore throat Respiratory: Denies cough, dyspnea or wheezes Cardiovascular: Denies palpitation, chest discomfort or lower extremity swelling Gastrointestinal:  Denies nausea, heartburn or change in bowel habits Skin: Denies abnormal skin rashes Lymphatics: Denies new lymphadenopathy or easy bruising Neurological:Denies numbness, tingling or new weaknesses Behavioral/Psych: Mood is stable, no new changes  All other systems were reviewed with the patient and are negative.  PHYSICAL EXAMINATION: ECOG PERFORMANCE STATUS: 0 - Asymptomatic  Vitals:    03/31/23 0904  BP: (!) 131/96  Pulse: 64  Resp: 16  Temp: 97.7 F (36.5 C)  SpO2: 97%   Filed Weights   03/31/23 0904  Weight: 202 lb 3.2 oz (91.7 kg)    GENERAL:alert, no distress and comfortable NEURO: no focal motor/sensory deficits  LABORATORY DATA:  I have reviewed the data as listed Lab Results  Component Value Date   WBC 5.7 03/16/2023   HGB 16.0 03/16/2023   HCT 47.2 03/16/2023   MCV 88.2 03/16/2023   PLT 213 03/16/2023   Recent Labs    09/16/22 0752 03/16/23 0745  NA 140 139  K 4.1 4.0  CL 107 106  CO2 27 26  GLUCOSE 108* 143*  BUN 18 18  CREATININE 1.03 1.05  CALCIUM 9.5 9.3  GFRNONAA >60 >60  PROT 6.9 6.6  ALBUMIN 4.5 4.3  AST 15 21  ALT 17 33  ALKPHOS 84 72  BILITOT 0.8 1.0    RADIOGRAPHIC STUDIES: I have reviewed CT imaging with the patient I have personally reviewed the radiological images as listed and agreed with the findings in the report. CT CHEST ABDOMEN PELVIS W CONTRAST  Result Date: 03/18/2023 CLINICAL DATA:  History of testicular cancer, seminoma, staging. * Tracking Code: BO * EXAM: CT CHEST, ABDOMEN, AND PELVIS WITH CONTRAST TECHNIQUE: Multidetector CT imaging of the chest, abdomen and pelvis was performed following the standard protocol during bolus administration of intravenous contrast. RADIATION DOSE REDUCTION: This exam was performed according to the departmental dose-optimization program which includes automated exposure control, adjustment of the mA and/or kV according to patient size and/or use of iterative reconstruction technique. CONTRAST:  OMNIPAQUE IOHEXOL 300 MG/ML  SOLN COMPARISON:  Multiple priors including most recent CT March 04, 2022. FINDINGS: CT CHEST FINDINGS Cardiovascular: Normal caliber abdominal aorta. No central pulmonary embolus on this nondedicated study. Normal size heart. No significant pericardial effusion/thickening. Mediastinum/Nodes: No supraclavicular adenopathy. No suspicious thyroid nodule. No  pathologically enlarged mediastinal, hilar or axillary lymph nodes. Similar appearance of the feathery soft tissue in the anterior mediastinum which conforms to underlying vasculature and is compatible with remnant/rebound thymic tissue. The esophagus is grossly unremarkable. Lungs/Pleura: No suspicious pulmonary nodules/masses. Tiny 3 mm nodule in the right middle lobe on image 95/4 is stable over multiple priors compatible with a benign finding. No pleural effusion. No pneumothorax. Musculoskeletal: No aggressive lytic or blastic lesion of bone. CT ABDOMEN PELVIS FINDINGS Hepatobiliary: No suspicious hepatic lesion. Gallbladder is unremarkable. No biliary ductal dilation. Pancreas: No pancreatic ductal dilation or evidence of acute inflammation. Spleen: No splenomegaly. Adrenals/Urinary Tract: Bilateral adrenal glands appear normal. No hydronephrosis. Kidneys demonstrate symmetric enhancement. Urinary bladder is unremarkable for degree of distension. Stomach/Bowel: Stomach is within normal limits. Appendix appears normal. No evidence of bowel wall thickening, distention, or inflammatory changes. Vascular/Lymphatic: Normal caliber abdominal aorta. Smooth IVC contours. The portal, splenic and superior mesenteric veins are patent. No pathologically enlarged abdominal or pelvic lymph nodes. Reproductive: Prostate gland appears normal.  Status post right orchiectomy. Other: Moderate-sized fat containing left inguinal hernia. No significant abdominopelvic free fluid. Musculoskeletal: No aggressive lytic or blastic lesion of bone. Chronic bilateral L5 pars defects. IMPRESSION: 1. Status post right orchiectomy without evidence of metastatic disease in the chest, abdomen or pelvis. 2. Moderate-sized fat containing left inguinal hernia. Electronically Signed   By: Maudry Mayhew M.D.   On: 03/18/2023 10:02    ASSESSMENT & PLAN:  History of testicular cancer He has normal tumor markers CT imaging was reviewed with the  patient which show no evidence of disease The patient is 5 years out from original diagnosis He is a long-term cancer survivor and does not need long-term follow-up  Left hydrocele I recommend follow-up with urologist and he agrees I will send referral  Inguinal hernia of left side without obstruction or gangrene He is quite symptomatic but given his hydrocele, I think that needs to be addressed first I will refer him to general surgery in the future for hernia repair after he has seen urologist  Family history of cancer in father I have reviewed this briefly with genetic counselor but at present time, he does not need genetic testing His brother will continue active surveillance  No orders of the defined types were placed in this encounter.   All questions were answered. The patient knows to call the clinic with any problems, questions or concerns. The total time spent in the appointment was 40 minutes encounter with patients including review of chart and various tests results, discussions about plan of care and coordination of care plan   Artis Delay, MD 03/31/2023 2:05 PM

## 2023-03-31 NOTE — Telephone Encounter (Signed)
-----   Message from Artis Delay, MD sent at 03/31/2023  2:07 PM EDT ----- I just finished my notes Please refer to Dr. Berneice Heinrich at [facility]

## 2023-03-31 NOTE — Assessment & Plan Note (Signed)
I recommend follow-up with urologist and he agrees I will send referral

## 2023-03-31 NOTE — Assessment & Plan Note (Signed)
I have reviewed this briefly with genetic counselor but at present time, he does not need genetic testing His brother will continue active surveillance

## 2023-04-06 ENCOUNTER — Ambulatory Visit: Payer: BC Managed Care – PPO | Admitting: Hematology and Oncology

## 2023-10-07 ENCOUNTER — Encounter (HOSPITAL_BASED_OUTPATIENT_CLINIC_OR_DEPARTMENT_OTHER): Payer: Self-pay | Admitting: Emergency Medicine

## 2023-10-07 ENCOUNTER — Other Ambulatory Visit: Payer: Self-pay

## 2023-10-07 ENCOUNTER — Emergency Department (HOSPITAL_BASED_OUTPATIENT_CLINIC_OR_DEPARTMENT_OTHER): Payer: 59

## 2023-10-07 ENCOUNTER — Emergency Department (HOSPITAL_BASED_OUTPATIENT_CLINIC_OR_DEPARTMENT_OTHER)
Admission: EM | Admit: 2023-10-07 | Discharge: 2023-10-07 | Disposition: A | Discharge status: Home / Self Care | Payer: 59

## 2023-10-07 DIAGNOSIS — M545 Low back pain, unspecified: Secondary | ICD-10-CM | POA: Diagnosis present

## 2023-10-07 DIAGNOSIS — S22000A Wedge compression fracture of unspecified thoracic vertebra, initial encounter for closed fracture: Secondary | ICD-10-CM | POA: Insufficient documentation

## 2023-10-07 DIAGNOSIS — S0990XA Unspecified injury of head, initial encounter: Secondary | ICD-10-CM | POA: Insufficient documentation

## 2023-10-07 DIAGNOSIS — Y9323 Activity, snow (alpine) (downhill) skiing, snow boarding, sledding, tobogganing and snow tubing: Secondary | ICD-10-CM | POA: Diagnosis not present

## 2023-10-07 DIAGNOSIS — W19XXXA Unspecified fall, initial encounter: Secondary | ICD-10-CM | POA: Insufficient documentation

## 2023-10-07 MED ORDER — OXYCODONE-ACETAMINOPHEN 5-325 MG PO TABS
1.0000 | ORAL_TABLET | Freq: Once | ORAL | Status: AC
  Administered 2023-10-07: 1 via ORAL
  Filled 2023-10-07: qty 1

## 2023-10-07 MED ORDER — OXYCODONE-ACETAMINOPHEN 5-325 MG PO TABS: 1.0000 | ORAL_TABLET | Freq: Four times a day (QID) | ORAL | 0 refills | Status: AC | PRN

## 2023-10-07 MED ORDER — KETOROLAC TROMETHAMINE 15 MG/ML IJ SOLN
15.0000 mg | Freq: Once | INTRAMUSCULAR | Status: AC
  Administered 2023-10-07: 15 mg via INTRAMUSCULAR
  Filled 2023-10-07: qty 1

## 2023-10-07 MED ORDER — KETOROLAC TROMETHAMINE 10 MG PO TABS: 10.0000 mg | ORAL_TABLET | Freq: Four times a day (QID) | ORAL | 0 refills | Status: AC | PRN

## 2023-10-07 NOTE — ED Notes (Signed)
 ED Provider at bedside.

## 2023-10-07 NOTE — Discharge Instructions (Signed)
 You do have a compression fracture on your CT scan today.  Please take the Percocet as needed for pain and follow-up with the neurosurgeon for reevaluation.  Do not drive or drink alcohol while taking the Percocet as may make you drowsy.

## 2023-10-07 NOTE — ED Provider Notes (Signed)
 [place] EMERGENCY DEPARTMENT AT [facility] Provider Note   CSN: [id number] Arrival date & time: 10/07/23  2028     History  Chief Complaint  Patient presents with   Head Injury    [name] is a 38 y.o. male.  38 year old male with past medical history of testicular cancer in remission presenting to the emergency department today with pain in his back after a fall while sledding earlier today.  The patient states that he was standing on a slide when he lost balance and fell backwards.  He did strike his head.  He states that he did not lose consciousness but did see stars.  He states that he is having pain in his lower neck and upper back since then.  He states that he also is having pain with deep inspiration.  This happened roughly 1 hour prior to arrival.  He denies any abdominal pain.  Denies any bowel or bladder dysfunction and saddle anesthesia.  The patient denies any weakness, numbness, or tingling.   Head Injury Associated symptoms: neck pain        Home Medications Prior to Admission medications   Medication Sig Start Date End Date Taking? Authorizing Provider  ketorolac  (TORADOL ) 10 MG tablet Take 1 tablet (10 mg total) by mouth every 6 (six) hours as needed. 10/07/23  Yes Ula Prentice SAUNDERS, MD  oxyCODONE -acetaminophen  (PERCOCET/ROXICET) 5-325 MG tablet Take 1 tablet by mouth every 6 (six) hours as needed for severe pain (pain score 7-10). 10/07/23  Yes Ula Prentice SAUNDERS, MD  Ascorbic Acid (VITAMIN C) 1000 MG tablet Take 1,000 mg by mouth daily.    [provider]  VITAMIN D, ERGOCALCIFEROL, PO Take 500 mg by mouth daily.    [provider]      Allergies    Naproxen    Review of Systems   Review of Systems  Musculoskeletal:  Positive for back pain and neck pain.  All other systems reviewed and are negative.   Physical Exam Updated Vital Signs BP 123/84   Pulse 63   Temp 98.8 F (37.1 C) (Oral)   Resp 18   SpO2 100%  Physical  Exam Vitals and nursing note reviewed.   Gen: NAD Eyes: PERRL, EOMI HEENT: no oropharyngeal swelling Neck: trachea midline, the patient is tender over the lower cervical spine with no step-offs or deformities Resp: clear to auscultation bilaterally Card: RRR, no murmurs, rubs, or gallops Abd: nontender, nondistended Extremities: no calf tenderness, no edema MSK: The patient is tender over the upper thoracic spine with no step-offs or deformities Vascular: 2+ radial pulses bilaterally, 2+ DP pulses bilaterally Neuro: Equal strength and sensation throughout bilateral upper and lower extremities Skin: no rashes Psyc: acting appropriately   ED Results / Procedures / Treatments   Labs (all labs ordered are listed, but only abnormal results are displayed) Labs Reviewed - No data to display  EKG None  Radiology DG Chest Portable 1 View Result Date: 10/07/2023 CLINICAL DATA:  Sledding accident. EXAM: PORTABLE CHEST 1 VIEW COMPARISON:  CT chest 03/17/2023 FINDINGS: The heart and mediastinal contours are within normal limits. No focal consolidation. No pulmonary edema. No pleural effusion. No pneumothorax. No acute osseous abnormality. IMPRESSION: No active disease. Electronically Signed   By: Morgane  Naveau M.D.   On: 10/07/2023 21:10   CT Cervical Spine Wo Contrast Result Date: 10/07/2023 CLINICAL DATA:  Ataxia, cervical trauma; Ataxia, thoracic trauma Sledding accident, struck back of head, denies LOC. History of testicular cancer.  EXAM: CT CERVICAL SPINE WITHOUT CONTRAST CT THORACIC SPINE WITHOUT CONTRAST TECHNIQUE: Multidetector CT imaging of the cervical and thoracic spine was performed without contrast. Multiplanar CT image reconstructions were also generated. RADIATION DOSE REDUCTION: This exam was performed according to the departmental dose-optimization program which includes automated exposure control, adjustment of the mA and/or kV according to patient size and/or use of iterative  reconstruction technique. COMPARISON:  CT chest abdomen pelvis 03/17/2023 FINDINGS: CT CERVICAL SPINE FINDINGS Alignment: Normal. Skull base and vertebrae: No acute fracture. No aggressive appearing focal osseous lesion or focal pathologic process. Soft tissues and spinal canal: No prevertebral fluid or swelling. No visible canal hematoma. Upper chest: Unremarkable. Other: None. CT THORACIC SPINE FINDINGS Alignment: Normal. Vertebrae: Interval development of a vague superior endplate concavity at the T4 level with at least 10% vertebral body height loss. Paraspinal and other soft tissues: Negative. Disc levels: Maintained. Patient other: No acute abnormality. IMPRESSION: 1. No acute displaced fracture or traumatic listhesis of the cervical spine. 2. Acute T4 superior endplate compression fracture. Electronically Signed   By: Morgane  Naveau M.D.   On: 10/07/2023 21:06   CT Thoracic Spine Wo Contrast Result Date: 10/07/2023 CLINICAL DATA:  Ataxia, cervical trauma; Ataxia, thoracic trauma Sledding accident, struck back of head, denies LOC. History of testicular cancer. EXAM: CT CERVICAL SPINE WITHOUT CONTRAST CT THORACIC SPINE WITHOUT CONTRAST TECHNIQUE: Multidetector CT imaging of the cervical and thoracic spine was performed without contrast. Multiplanar CT image reconstructions were also generated. RADIATION DOSE REDUCTION: This exam was performed according to the departmental dose-optimization program which includes automated exposure control, adjustment of the mA and/or kV according to patient size and/or use of iterative reconstruction technique. COMPARISON:  CT chest abdomen pelvis 03/17/2023 FINDINGS: CT CERVICAL SPINE FINDINGS Alignment: Normal. Skull base and vertebrae: No acute fracture. No aggressive appearing focal osseous lesion or focal pathologic process. Soft tissues and spinal canal: No prevertebral fluid or swelling. No visible canal hematoma. Upper chest: Unremarkable. Other: None. CT THORACIC  SPINE FINDINGS Alignment: Normal. Vertebrae: Interval development of a vague superior endplate concavity at the T4 level with at least 10% vertebral body height loss. Paraspinal and other soft tissues: Negative. Disc levels: Maintained. Patient other: No acute abnormality. IMPRESSION: 1. No acute displaced fracture or traumatic listhesis of the cervical spine. 2. Acute T4 superior endplate compression fracture. Electronically Signed   By: Morgane  Naveau M.D.   On: 10/07/2023 21:06   CT Head Wo Contrast Result Date: 10/07/2023 CLINICAL DATA:  Head trauma, abnormal mental status (Age 34-64y) EXAM: CT HEAD WITHOUT CONTRAST TECHNIQUE: Contiguous axial images were obtained from the base of the skull through the vertex without intravenous contrast. RADIATION DOSE REDUCTION: This exam was performed according to the departmental dose-optimization program which includes automated exposure control, adjustment of the mA and/or kV according to patient size and/or use of iterative reconstruction technique. COMPARISON:  CT head 09/29/2017 FINDINGS: Brain: No evidence of large-territorial acute infarction. No parenchymal hemorrhage. No mass lesion. No extra-axial collection. No mass effect or midline shift. No hydrocephalus. Basilar cisterns are patent. Vascular: No hyperdense vessel. Skull: No acute fracture or focal lesion. Sinuses/Orbits: Paranasal sinuses and mastoid air cells are clear. The orbits are unremarkable. Other: None. IMPRESSION: No acute intracranial abnormality. Electronically Signed   By: Morgane  Naveau M.D.   On: 10/07/2023 21:02    Procedures Procedures    Medications Ordered in ED Medications  oxyCODONE -acetaminophen  (PERCOCET/ROXICET) 5-325 MG per tablet 1 tablet (1 tablet Oral Given 10/07/23 2043)  ketorolac  (  TORADOL ) 15 MG/ML injection 15 mg (15 mg Intramuscular Given 10/07/23 2150)    ED Course/ Medical Decision Making/ A&P                                 Medical Decision  Making 38 year old male with past medical history of testicular cancer presented to the emergency department today with neck pain and difficulty/painful breathing after he fell off of a slide earlier.  I will further evaluate him here with a CT scan of his head, cervical spine and thoracic spine for further evaluation for acute traumatic injuries in addition to a chest x-ray to evaluate for pneumothorax.  I will give patient Percocet for pain.  Will reevaluate for ultimate disposition.  The patient's CT scan did show a compression fracture.  The remainder of his workup was unremarkable.  I did speak with Dr. Gillie who recommends pain management and outpatient follow-up.  He is discharged with return precautions.  Amount and/or Complexity of Data Reviewed Radiology: ordered.  Risk Prescription drug management.           Final Clinical Impression(s) / ED Diagnoses Final diagnoses:  Compression fracture of body of thoracic vertebra (HCC)    Rx / DC Orders ED Discharge Orders          Ordered    oxyCODONE -acetaminophen  (PERCOCET/ROXICET) 5-325 MG tablet  Every 6 hours PRN        10/07/23 2141    ketorolac  (TORADOL ) 10 MG tablet  Every 6 hours PRN        10/07/23 2144              Ula Prentice SAUNDERS, MD 10/08/23 1530

## 2023-10-07 NOTE — ED Triage Notes (Signed)
 Patient presents via EMS after sledding accident tonight. Reports striking back of head on ground. Denies loc, however states saw stars for a minute. Patient c/o pain to upper back and head upon arrival to ED. Patient A&OX4. Neuro intact. Denies any other injuries

## 2023-11-28 IMAGING — CT CT CHEST-ABD-PELV W/ CM
2 of 4 series · 13 of 36 positions shown, 15 images · IV contrast (agent unspecified)
Comparison: Multiple priors including CT March 01, 2021

CLINICAL DATA: Testicular cancer, seminoma, staging. * Tracking
Code: BO *

EXAM:
CT CHEST, ABDOMEN, AND PELVIS WITH CONTRAST
TECHNIQUE: Multidetector CT imaging of the chest, abdomen and pelvis was
performed following the standard protocol during bolus
administration of intravenous contrast.

[Series 2: cap with · axial · 0.96mm/px · z∈[-328,+257]mm · 10 of 144 slices shown, 12 images]
[im 14/144  mediastinal]
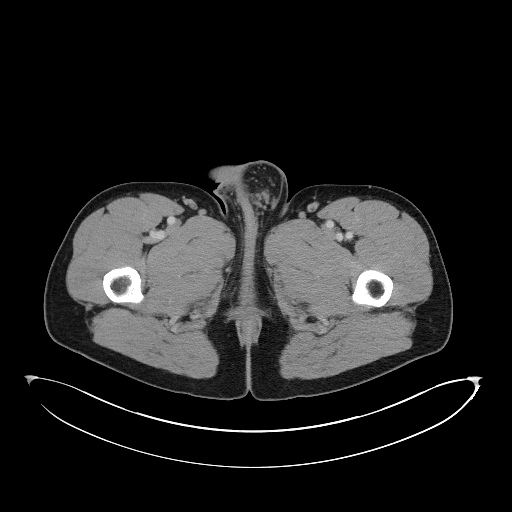
[im 14/144  bone]
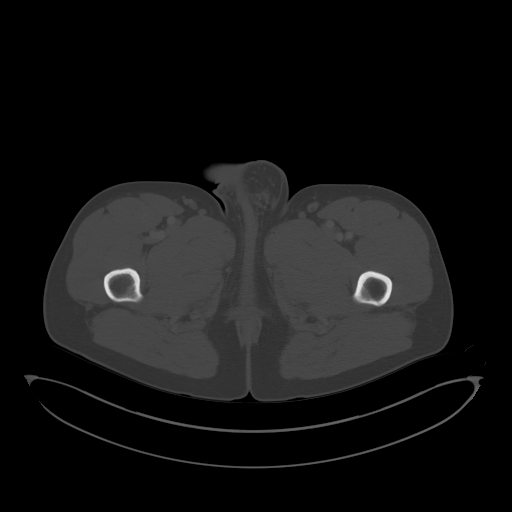
[im 27/144  mediastinal]
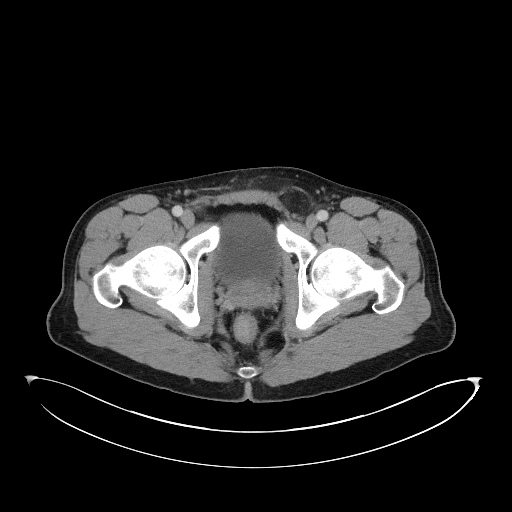
[im 40/144  mediastinal]
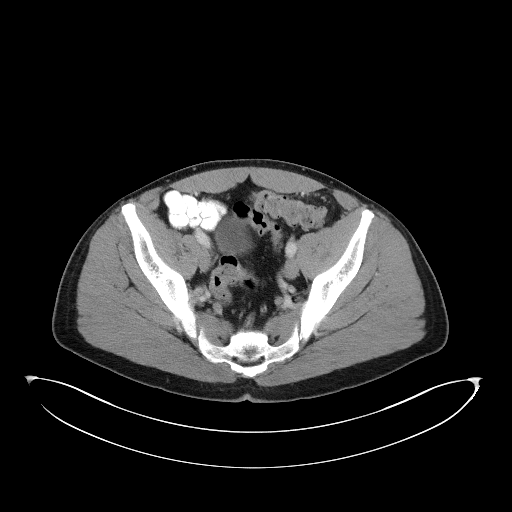
[im 53/144  mediastinal]
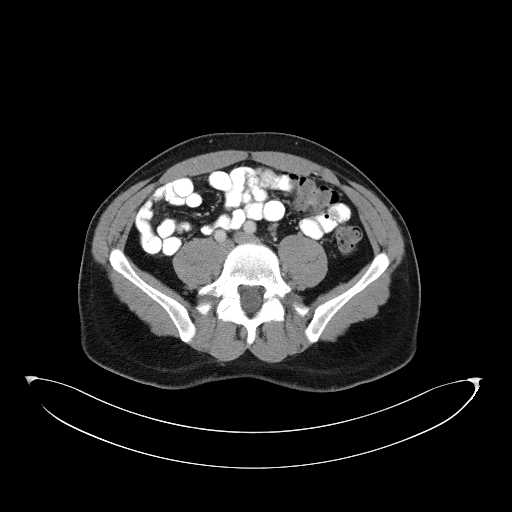
[im 66/144  mediastinal]
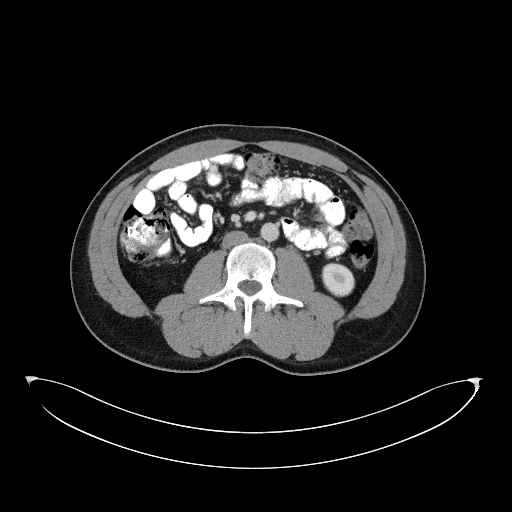
[im 79/144  mediastinal]
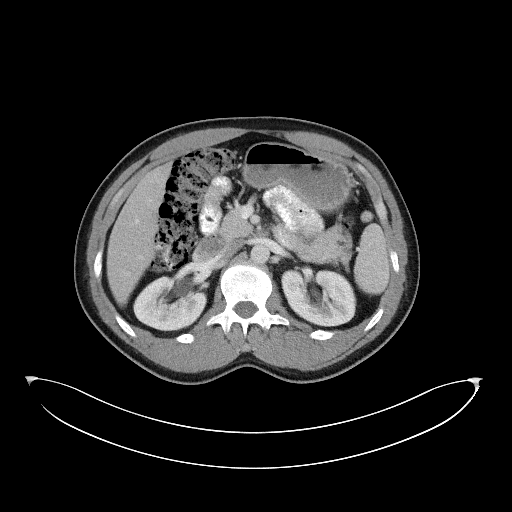
[im 92/144  mediastinal]
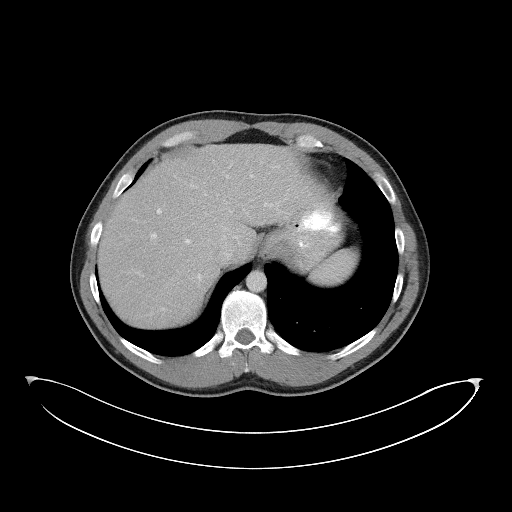
[im 105/144  mediastinal]
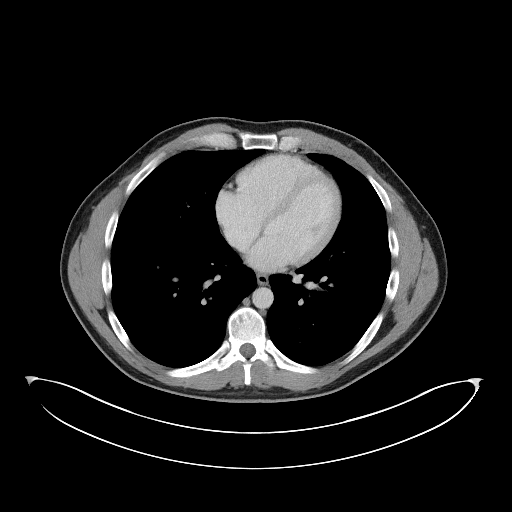
[im 118/144  mediastinal]
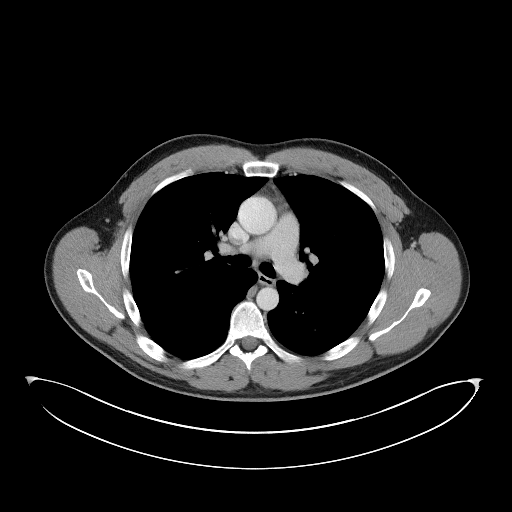
[im 118/144  bone]
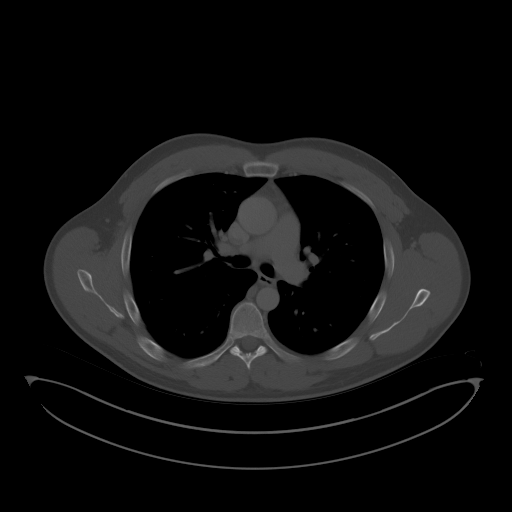
[im 131/144  mediastinal]
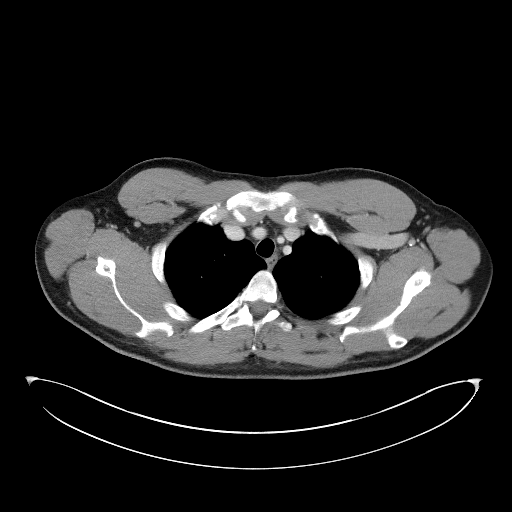

[Series 4: coronals · coronal · 0.78mm/px · 3 of 144 slices shown]
[im 29/144  mediastinal]
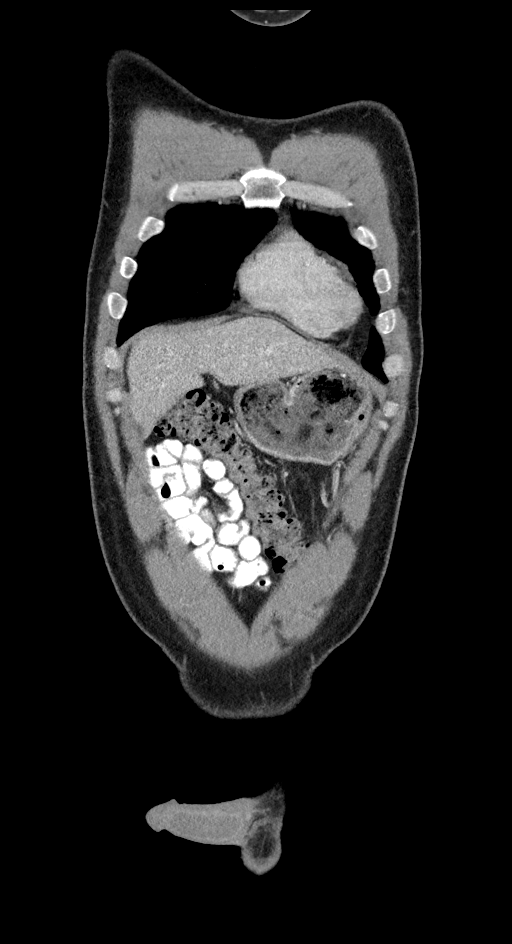
[im 58/144  mediastinal]
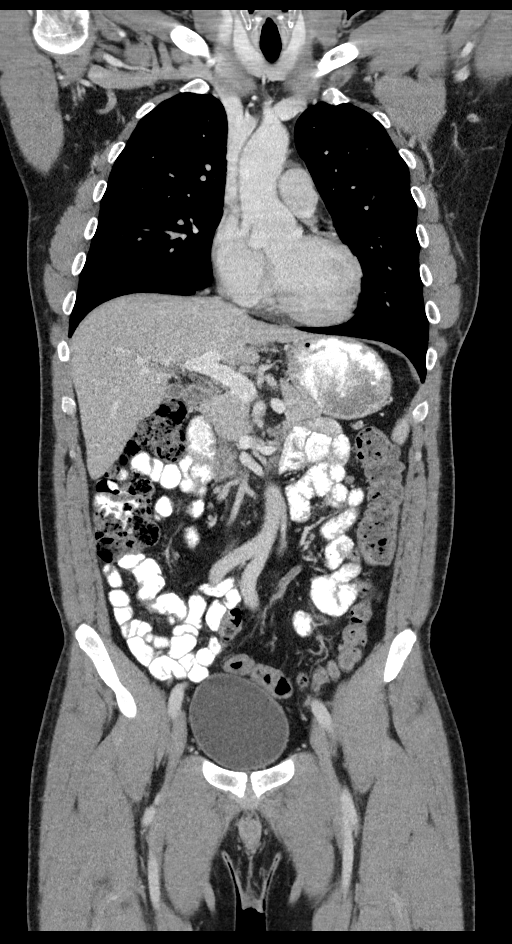
[im 86/144  mediastinal]
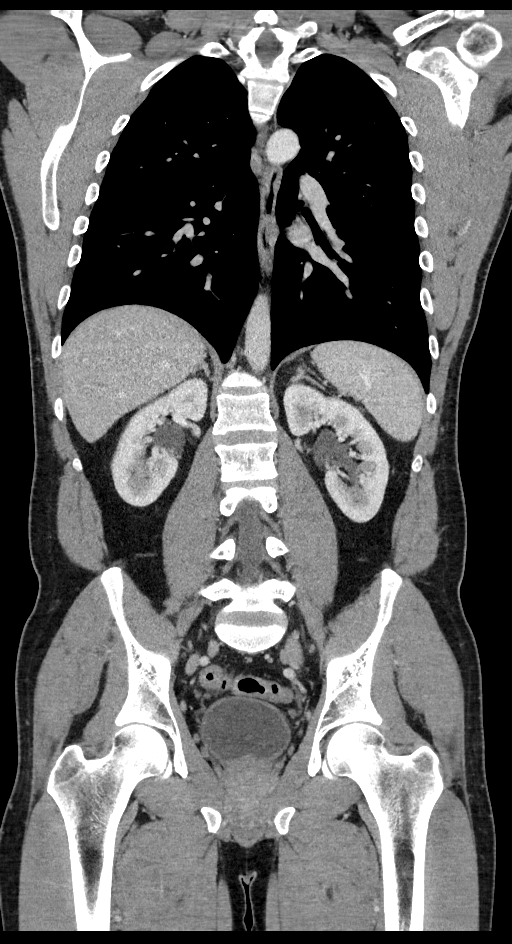

[13 of 36 positions shown; findings below may reference images not displayed]

RADIATION DOSE REDUCTION: This exam was performed according to the
departmental dose-optimization program which includes automated
exposure control, adjustment of the mA and/or kV according to
patient size and/or use of iterative reconstruction technique.

CONTRAST:  100mL OMNIPAQUE IOHEXOL 300 MG/ML  SOLN
FINDINGS: CT CHEST FINDINGS

Cardiovascular: Normal caliber thoracic aorta. No central pulmonary
embolus on this nondedicated study. Normal size heart. No
significant pericardial effusion/thickening.

Mediastinum/Nodes: Supraclavicular adenopathy. No suspicious thyroid
nodule. No pathologically enlarged mediastinal, hilar or axillary
lymph nodes. Esophagus is grossly unremarkable. Tiny hiatal hernia.

Lungs/Pleura: Tiny 3 mm pulmonary nodule in the right middle lobe on
image 89/6 is stable over multiple priors. No new suspicious
pulmonary nodules or masses. No pleural effusion. No pneumothorax.

Musculoskeletal: No chest wall mass or suspicious bone lesions
identified.

CT ABDOMEN PELVIS FINDINGS

Hepatobiliary: No suspicious hepatic lesion. Gallbladder is
unremarkable. No biliary ductal dilation.

Pancreas: No pancreatic ductal dilation or evidence of acute
inflammation.

Spleen: No splenomegaly or focal splenic lesion.

Adrenals/Urinary Tract: Bilateral adrenal glands appear normal. No
hydronephrosis. Kidneys demonstrate symmetric enhancement. No
suspicious renal mass. Urinary bladder is unremarkable for degree of
distension.

Stomach/Bowel: Radiopaque enteric contrast material traverses the
ileocecal valve. Stomach is unremarkable for degree of distension.
No pathologic dilation of small or large bowel. Moderate volume of
formed stool throughout the colon suggestive of constipation.

Vascular/Lymphatic: Normal caliber abdominal aorta. No
pathologically enlarged abdominal or pelvic lymph nodes.

Reproductive: Prostate gland appears normal. Status post right
orchiectomy.

Other: Moderate fat containing left inguinal hernia. No significant
abdominopelvic free fluid.

Musculoskeletal: No suspicious lytic or blastic lesion of bone.
Chronic bilateral L5 pars defects.
IMPRESSION: 1. Evidence of metastatic disease in the chest abdomen or pelvis.
2. Status post right orchiectomy.
3. Moderate-sized fat containing left inguinal hernia.

## 2024-02-18 ENCOUNTER — Encounter (HOSPITAL_BASED_OUTPATIENT_CLINIC_OR_DEPARTMENT_OTHER): Payer: Self-pay | Admitting: Emergency Medicine

## 2024-02-18 ENCOUNTER — Other Ambulatory Visit: Payer: Self-pay

## 2024-02-18 ENCOUNTER — Emergency Department (HOSPITAL_BASED_OUTPATIENT_CLINIC_OR_DEPARTMENT_OTHER)

## 2024-02-18 ENCOUNTER — Emergency Department (HOSPITAL_BASED_OUTPATIENT_CLINIC_OR_DEPARTMENT_OTHER)
Admission: EM | Admit: 2024-02-18 | Discharge: 2024-02-18 | Disposition: A | Discharge status: Home / Self Care | Attending: Emergency Medicine | Admitting: Emergency Medicine

## 2024-02-18 DIAGNOSIS — W2209XA Striking against other stationary object, initial encounter: Secondary | ICD-10-CM | POA: Diagnosis not present

## 2024-02-18 DIAGNOSIS — M79642 Pain in left hand: Secondary | ICD-10-CM | POA: Diagnosis present

## 2024-02-18 DIAGNOSIS — S60222A Contusion of left hand, initial encounter: Secondary | ICD-10-CM | POA: Diagnosis not present

## 2024-02-18 NOTE — ED Notes (Signed)
 Dc instructions reviewed with patient. Patient voiced understanding. Dc with belongings.

## 2024-02-18 NOTE — Discharge Instructions (Signed)
 Please read and follow all provided instructions.  Your diagnoses today include:  1. Contusion of left hand, initial encounter     Tests performed today include: An x-ray of the affected area - does NOT show any broken bones Vital signs. See below for your results today.   Medications prescribed:  Please use over-the-counter NSAID medications (ibuprofen, naproxen) or Tylenol  (acetaminophen ) as directed on the packaging for pain -- as long as you do not have any reasons avoid these medications. Reasons to avoid NSAID medications include: weak kidneys, a history of bleeding in your stomach or gut, or uncontrolled high blood pressure or previous heart attack. Reasons to avoid Tylenol  include: liver problems or ongoing alcohol use. Never take more than 4000mg  or 8 Extra strength Tylenol  in a 24 hour period.     Take any prescribed medications only as directed.  Home care instructions:  Follow any educational materials contained in this packet Follow R.I.C.E. Protocol: R - rest your injury  I  - use ice on injury without applying directly to skin C - compress injury with bandage or splint E - elevate the injury as much as possible  Follow-up instructions: Please follow-up with your primary care provider if you continue to have significant pain in 1 week. In this case you may have a more severe injury that requires further care.   Return instructions:  Please return if your fingers are numb or tingling, appear gray or blue, or you have severe pain (also elevate the arm and loosen splint or wrap if you were given one) Please return to the Emergency Department if you experience worsening symptoms.  Please return if you have any other emergent concerns.  Additional Information:  Your vital signs today were: BP 122/74   Pulse 62   Temp 98.1 F (36.7 C)   Resp 16   SpO2 98%  If your blood pressure (BP) was elevated above 135/85 this visit, please have this repeated by your doctor within  one month. --------------

## 2024-02-18 NOTE — ED Provider Notes (Signed)
 [facility] AT [facility] Provider Note   CSN: [id number] Arrival date & time: 02/18/24  1037     History  No chief complaint on file.   [name] is a 38 y.o. male.  Patient presents to the emergency department today for evaluation of left hand pain.  Symptoms started acutely just prior to arrival when he punched a couch.  Patient has pain around the ulnar aspect of the hand.  Reports decreased grip strength.  No wrist, elbow, or shoulder pain.  No treatments prior to arrival.       Home Medications Prior to Admission medications   Medication Sig Start Date End Date Taking? Authorizing Provider  Ascorbic Acid (VITAMIN C) 1000 MG tablet Take 1,000 mg by mouth daily.    [provider]  ketorolac  (TORADOL ) 10 MG tablet Take 1 tablet (10 mg total) by mouth every 6 (six) hours as needed. 10/07/23   Carin Charleston, MD  oxyCODONE -acetaminophen  (PERCOCET/ROXICET) 5-325 MG tablet Take 1 tablet by mouth every 6 (six) hours as needed for severe pain (pain score 7-10). 10/07/23   Carin Charleston, MD  VITAMIN D, ERGOCALCIFEROL, PO Take 500 mg by mouth daily.    [provider]      Allergies    Naproxen    Review of Systems   Review of Systems  Physical Exam Updated Vital Signs BP 122/74   Pulse 62   Temp 98.1 F (36.7 C)   Resp 16   SpO2 98%  Physical Exam Vitals and nursing note reviewed.  Constitutional:      Appearance: He is well-developed.  HENT:     Head: Normocephalic and atraumatic.  Eyes:     Conjunctiva/sclera: Conjunctivae normal.  Cardiovascular:     Pulses: Normal pulses. No decreased pulses.  Musculoskeletal:        General: Tenderness present.     Right wrist: No tenderness. Normal range of motion.     Right hand: Tenderness and bony tenderness present. Normal range of motion. Normal pulse.     Cervical back: Normal range of motion and neck supple.     Right lower leg: No edema.     Left lower leg: No  edema.     Comments: Tenderness to over the fifth metacarpal bone without deformity on the right hand.  He is able to flex and extend all digits but does report increasing pain.  Skin:    General: Skin is warm and dry.  Neurological:     Mental Status: He is alert.     Sensory: No sensory deficit.     Comments: Motor, sensation, and vascular distal to the injury is fully intact.   Psychiatric:        Mood and Affect: Mood normal.     ED Results / Procedures / Treatments   Labs (all labs ordered are listed, but only abnormal results are displayed) Labs Reviewed - No data to display  EKG None  Radiology DG Hand Complete Right Result Date: 02/18/2024 CLINICAL DATA:  Blunt force to right hand right lateral hand pain and swelling. EXAM: RIGHT HAND - COMPLETE 3+ VIEW COMPARISON:  None Available. FINDINGS: No signs of acute fracture or subluxation. No significant arthropathy. Soft tissues are unremarkable. IMPRESSION: Negative. Electronically Signed   By: Kimberley Penman M.D.   On: 02/18/2024 11:34    Procedures Procedures    Medications Ordered in ED Medications - No data to display  ED Course/ Medical Decision  Making/ A&P    Patient seen and examined. History obtained directly from patient. Work-up including labs, imaging, EKG ordered in triage, if performed, were reviewed.    Labs/EKG: None ordered  Imaging: Independently reviewed and interpreted.  This included: X-ray of the hand, agree no fractures.  Medications/Fluids: None ordered.  Offered NSAID, patient declines.  Most recent vital signs reviewed and are as follows: BP 122/74   Pulse 62   Temp 98.1 F (36.7 C)   Resp 16   SpO2 98%   Initial impression: Contusion of hand.  Home treatment plan: Rest protocol, NSAIDs  Return instructions discussed with patient: New or worsening symptoms  Follow-up instructions discussed with patient: PCP in 1 week for recheck if not improving.                                 Medical Decision Making Amount and/or Complexity of Data Reviewed Radiology: ordered.   Patient with hand contusion, negative x-rays.  No dislocations.  Hand is neurovascularly intact.  Recommend routine care at this point.  Follow-up as necessary.        Final Clinical Impression(s) / ED Diagnoses Final diagnoses:  Contusion of left hand, initial encounter    Rx / DC Orders ED Discharge Orders     None         Lyna Sandhoff, PA-C 02/18/24 1200    Lind Repine, MD 02/19/24 1227

## 2024-02-18 NOTE — ED Triage Notes (Signed)
 Pt punched his couch today, thinking it was a pillow. Right hand sore .
